# Patient Record
Sex: Female | Born: 1945 | Race: White | Hispanic: No | Smoking: Former smoker
Health system: Southern US, Community
[De-identification: ages and names within clinical notes are randomized; demographics above are authoritative.]

## PROBLEM LIST (undated history)

## (undated) DIAGNOSIS — I4891 Unspecified atrial fibrillation: Secondary | ICD-10-CM

## (undated) DIAGNOSIS — N390 Urinary tract infection, site not specified: Secondary | ICD-10-CM

## (undated) DIAGNOSIS — E039 Hypothyroidism, unspecified: Secondary | ICD-10-CM

## (undated) DIAGNOSIS — K219 Gastro-esophageal reflux disease without esophagitis: Secondary | ICD-10-CM

## (undated) DIAGNOSIS — J45909 Unspecified asthma, uncomplicated: Secondary | ICD-10-CM

## (undated) DIAGNOSIS — I340 Nonrheumatic mitral (valve) insufficiency: Secondary | ICD-10-CM

## (undated) DIAGNOSIS — F419 Anxiety disorder, unspecified: Secondary | ICD-10-CM

## (undated) DIAGNOSIS — R7303 Prediabetes: Secondary | ICD-10-CM

## (undated) DIAGNOSIS — E669 Obesity, unspecified: Secondary | ICD-10-CM

## (undated) DIAGNOSIS — I351 Nonrheumatic aortic (valve) insufficiency: Secondary | ICD-10-CM

## (undated) DIAGNOSIS — I10 Essential (primary) hypertension: Secondary | ICD-10-CM

## (undated) DIAGNOSIS — M1990 Unspecified osteoarthritis, unspecified site: Secondary | ICD-10-CM

## (undated) DIAGNOSIS — Z889 Allergy status to unspecified drugs, medicaments and biological substances status: Secondary | ICD-10-CM

## (undated) DIAGNOSIS — E785 Hyperlipidemia, unspecified: Secondary | ICD-10-CM

## (undated) DIAGNOSIS — D179 Benign lipomatous neoplasm, unspecified: Secondary | ICD-10-CM

## (undated) DIAGNOSIS — Z8489 Family history of other specified conditions: Secondary | ICD-10-CM

## (undated) DIAGNOSIS — D649 Anemia, unspecified: Secondary | ICD-10-CM

## (undated) DIAGNOSIS — I4892 Unspecified atrial flutter: Secondary | ICD-10-CM

## (undated) DIAGNOSIS — E079 Disorder of thyroid, unspecified: Secondary | ICD-10-CM

## (undated) HISTORY — PX: TONSILLECTOMY: SUR1361

## (undated) HISTORY — PX: ROTATOR CUFF REPAIR: SHX139

## (undated) HISTORY — PX: COLONOSCOPY: SHX174

---

## 1974-09-17 HISTORY — PX: VAGINAL HYSTERECTOMY: SUR661

## 1977-09-17 HISTORY — PX: OTHER SURGICAL HISTORY: SHX169

## 1978-09-17 HISTORY — PX: APPENDECTOMY: SHX54

## 1979-09-18 HISTORY — PX: BACK SURGERY: SHX140

## 2012-09-17 HISTORY — PX: KNEE ARTHROSCOPY W/ MENISCAL REPAIR: SHX1877

## 2013-06-25 HISTORY — PX: CARDIOVERSION: SHX1299

## 2014-01-30 HISTORY — PX: ATRIAL FIBRILLATION ABLATION: SHX5732

## 2014-12-29 ENCOUNTER — Other Ambulatory Visit (HOSPITAL_COMMUNITY): Payer: Self-pay | Admitting: Orthopaedic Surgery

## 2015-01-04 NOTE — Pre-Procedure Instructions (Signed)
Kim Rice  01/04/2015   Your procedure is scheduled on:  Tues, May 3 @ 12:50 PM  Report to [facility] and go to Admitting at 10:45 AM.  Call this number if you have problems the morning of surgery: [phone]   Remember:   Do not eat food or drink liquids after midnight.   Take these medicines the morning of surgery with A SIP OF WATER: Amlodipine(Norvasc),Atenolol(Tenormin),Nexium(Esomeprazole),Advair<Bring Your Inhaler With You>,and Synthroid(Levothyroxine)               Stop taking your Naproxen and Aspirin. No Goody's,BC's,Ibuprofen,Fish Oil,or any Herbal Medications.    Do not wear jewelry, make-up or nail polish.  Do not wear lotions, powders, or perfumes. You may wear deodorant.  Do not shave 48 hours prior to surgery.   Do not bring valuables to the hospital.  [facility] is not responsible                  for any belongings or valuables.               Contacts, dentures or bridgework may not be worn into surgery.  Leave suitcase in the car. After surgery it may be brought to your room.  For patients admitted to the hospital, discharge time is determined by your                treatment team.                Special Instructions:  [place] - Preparing for Surgery  Before surgery, you can play an important role.  Because skin is not sterile, your skin needs to be as free of germs as possible.  You can reduce the number of germs on you skin by washing with CHG (chlorahexidine gluconate) soap before surgery.  CHG is an antiseptic cleaner which kills germs and bonds with the skin to continue killing germs even after washing.  Please DO NOT use if you have an allergy to CHG or antibacterial soaps.  If your skin becomes reddened/irritated stop using the CHG and inform your nurse when you arrive at Short Stay.  Do not shave (including legs and underarms) for at least 48 hours prior to the first CHG shower.  You may shave your face.  Please follow these  instructions carefully:   1.  Shower with CHG Soap the night before surgery and the                                morning of Surgery.  2.  If you choose to wash your hair, wash your hair first as usual with your       normal shampoo.  3.  After you shampoo, rinse your hair and body thoroughly to remove the                      Shampoo.  4.  Use CHG as you would any other liquid soap.  You can apply chg directly       to the skin and wash gently with scrungie or a clean washcloth.  5.  Apply the CHG Soap to your body ONLY FROM THE NECK DOWN.        Do not use on open wounds or open sores.  Avoid contact with your eyes,       ears, mouth and genitals (private parts).  Wash genitals (  private parts)       with your normal soap.  6.  Wash thoroughly, paying special attention to the area where your surgery        will be performed.  7.  Thoroughly rinse your body with warm water from the neck down.  8.  DO NOT shower/wash with your normal soap after using and rinsing off       the CHG Soap.  9.  Pat yourself dry with a clean towel.            10.  Wear clean pajamas.            11.  Place clean sheets on your bed the night of your first shower and do not        sleep with pets.  Day of Surgery  Do not apply any lotions/deoderants the morning of surgery.  Please wear clean clothes to the hospital/surgery center.     Please read over the following fact sheets that you were given: Pain Booklet, Coughing and Deep Breathing, MRSA Information and Surgical Site Infection Prevention

## 2015-01-05 ENCOUNTER — Encounter (HOSPITAL_COMMUNITY)
Admission: RE | Admit: 2015-01-05 | Discharge: 2015-01-05 | Disposition: A | Discharge status: Home / Self Care | Payer: Medicare Other | Source: Ambulatory Visit | Attending: Orthopaedic Surgery | Admitting: Orthopaedic Surgery

## 2015-01-05 ENCOUNTER — Encounter (HOSPITAL_COMMUNITY): Payer: Self-pay

## 2015-01-05 DIAGNOSIS — Z87891 Personal history of nicotine dependence: Secondary | ICD-10-CM | POA: Diagnosis not present

## 2015-01-05 DIAGNOSIS — I4892 Unspecified atrial flutter: Secondary | ICD-10-CM | POA: Insufficient documentation

## 2015-01-05 DIAGNOSIS — Z01810 Encounter for preprocedural cardiovascular examination: Secondary | ICD-10-CM | POA: Insufficient documentation

## 2015-01-05 DIAGNOSIS — I4891 Unspecified atrial fibrillation: Secondary | ICD-10-CM | POA: Diagnosis not present

## 2015-01-05 DIAGNOSIS — Z01812 Encounter for preprocedural laboratory examination: Secondary | ICD-10-CM | POA: Diagnosis not present

## 2015-01-05 DIAGNOSIS — M179 Osteoarthritis of knee, unspecified: Secondary | ICD-10-CM | POA: Insufficient documentation

## 2015-01-05 DIAGNOSIS — E785 Hyperlipidemia, unspecified: Secondary | ICD-10-CM | POA: Diagnosis not present

## 2015-01-05 DIAGNOSIS — E039 Hypothyroidism, unspecified: Secondary | ICD-10-CM | POA: Diagnosis not present

## 2015-01-05 DIAGNOSIS — Z01818 Encounter for other preprocedural examination: Secondary | ICD-10-CM | POA: Insufficient documentation

## 2015-01-05 DIAGNOSIS — I10 Essential (primary) hypertension: Secondary | ICD-10-CM | POA: Diagnosis not present

## 2015-01-05 HISTORY — DX: Prediabetes: R73.03

## 2015-01-05 HISTORY — DX: Disorder of thyroid, unspecified: E07.9

## 2015-01-05 HISTORY — DX: Family history of other specified conditions: Z84.89

## 2015-01-05 HISTORY — DX: Unspecified asthma, uncomplicated: J45.909

## 2015-01-05 HISTORY — DX: Unspecified osteoarthritis, unspecified site: M19.90

## 2015-01-05 HISTORY — DX: Gastro-esophageal reflux disease without esophagitis: K21.9

## 2015-01-05 HISTORY — DX: Hypothyroidism, unspecified: E03.9

## 2015-01-05 HISTORY — DX: Nonrheumatic mitral (valve) insufficiency: I34.0

## 2015-01-05 HISTORY — DX: Benign lipomatous neoplasm, unspecified: D17.9

## 2015-01-05 HISTORY — DX: Unspecified atrial flutter: I48.92

## 2015-01-05 HISTORY — DX: Allergy status to unspecified drugs, medicaments and biological substances: Z88.9

## 2015-01-05 HISTORY — DX: Urinary tract infection, site not specified: N39.0

## 2015-01-05 HISTORY — DX: Anxiety disorder, unspecified: F41.9

## 2015-01-05 HISTORY — DX: Anemia, unspecified: D64.9

## 2015-01-05 HISTORY — DX: Hyperlipidemia, unspecified: E78.5

## 2015-01-05 HISTORY — DX: Nonrheumatic aortic (valve) insufficiency: I35.1

## 2015-01-05 HISTORY — DX: Unspecified atrial fibrillation: I48.91

## 2015-01-05 HISTORY — DX: Essential (primary) hypertension: I10

## 2015-01-05 HISTORY — DX: Obesity, unspecified: E66.9

## 2015-01-05 LAB — SURGICAL PCR SCREEN
MRSA, PCR: NEGATIVE
Staphylococcus aureus: NEGATIVE

## 2015-01-05 LAB — BASIC METABOLIC PANEL
Anion gap: 9 (ref 5–15)
BUN: 17 mg/dL (ref 6–23)
CALCIUM: 9.6 mg/dL (ref 8.4–10.5)
CO2: 25 mmol/L (ref 19–32)
Chloride: 106 mmol/L (ref 96–112)
Creatinine, Ser: 0.79 mg/dL (ref 0.50–1.10)
GFR calc Af Amer: >90 mL/min (ref >90)
GFR calc non Af Amer: 84 mL/min — ABNORMAL LOW (ref >90)
GLUCOSE: 96 mg/dL (ref 70–99)
Potassium: 3.6 mmol/L (ref 3.5–5.1)
Sodium: 140 mmol/L (ref 135–145)

## 2015-01-05 LAB — CBC
HEMATOCRIT: 41.3 % (ref 36.0–46.0)
Hemoglobin: 14.1 g/dL (ref 12.0–15.0)
MCH: 30.7 pg (ref 26.0–34.0)
MCHC: 34.1 g/dL (ref 30.0–36.0)
MCV: 90 fL (ref 78.0–100.0)
PLATELETS: 280 10*3/uL (ref 150–400)
RBC: 4.59 MIL/uL (ref 3.87–5.11)
RDW: 12.9 % (ref 11.5–15.5)
WBC: 9.6 10*3/uL (ref 4.0–10.5)

## 2015-01-05 LAB — PROTIME-INR
INR: 1 (ref 0.00–1.49)
PROTHROMBIN TIME: 13.3 s (ref 11.6–15.2)

## 2015-01-05 LAB — APTT: aPTT: 37 seconds (ref 24–37)

## 2015-01-05 NOTE — Progress Notes (Signed)
Pt has history of atrial fib and atrial flutter, had an ablation 01/2014 and has not been in either one since. She states she has been told that she has aortic valve regurgitation, mitral valve regurgitation and aortic stenosis, but as far as she knows she has not had any problems with them. Pt denies any chest pain or sob. Have requested cardiac studies from [facility] and [facility]

## 2015-01-05 NOTE — Pre-Procedure Instructions (Signed)
Kim Rice  01/05/2015   Your procedure is scheduled on:  Tues, May 3 @ 12:50 PM  Report to [facility] and go to Admitting at 10:45 AM.  Call this number if you have problems the morning of surgery: [phone]   Remember:   Do not eat food or drink liquids after midnight.   Take these medicines the morning of surgery with A SIP OF WATER: Amlodipine(Norvasc), Atenolol(Tenormin), Nexium(Esomeprazole), Synthroid(Levothyroxine), Advair Inhaler - if needed               Stop taking your Naproxen and Aspirin 7 days prior to surgery. No Goody's,BC's,Ibuprofen,Fish Oil,or any Herbal Medications.    Do not wear jewelry, make-up or nail polish.  Do not wear lotions, powders, or perfumes. You may wear deodorant.  Do not shave 48 hours prior to surgery.   Do not bring valuables to the hospital.  [facility] is not responsible                  for any belongings or valuables.               Contacts, dentures or bridgework may not be worn into surgery.  Leave suitcase in the car. After surgery it may be brought to your room.  For patients admitted to the hospital, discharge time is determined by your                treatment team.                Special Instructions:  [place] - Preparing for Surgery  Before surgery, you can play an important role.  Because skin is not sterile, your skin needs to be as free of germs as possible.  You can reduce the number of germs on you skin by washing with CHG (chlorahexidine gluconate) soap before surgery.  CHG is an antiseptic cleaner which kills germs and bonds with the skin to continue killing germs even after washing.  Please DO NOT use if you have an allergy to CHG or antibacterial soaps.  If your skin becomes reddened/irritated stop using the CHG and inform your nurse when you arrive at Short Stay.  Do not shave (including legs and underarms) for at least 48 hours prior to the first CHG shower.  You may shave your face.  Please follow  these instructions carefully:   1.  Shower with CHG Soap the night before surgery and the                                morning of Surgery.  2.  If you choose to wash your hair, wash your hair first as usual with your       normal shampoo.  3.  After you shampoo, rinse your hair and body thoroughly to remove the                      Shampoo.  4.  Use CHG as you would any other liquid soap.  You can apply chg directly       to the skin and wash gently with scrungie or a clean washcloth.  5.  Apply the CHG Soap to your body ONLY FROM THE NECK DOWN.        Do not use on open wounds or open sores.  Avoid contact with your eyes, ears, mouth and genitals (private parts).  Wash genitals (private parts) with your normal soap.  6.  Wash thoroughly, paying special attention to the area where your surgery        will be performed.  7.  Thoroughly rinse your body with warm water from the neck down.  8.  DO NOT shower/wash with your normal soap after using and rinsing off       the CHG Soap.  9.  Pat yourself dry with a clean towel.            10.  Wear clean pajamas.            11.  Place clean sheets on your bed the night of your first shower and do not        sleep with pets.  Day of Surgery  Do not apply any lotions the morning of surgery.  Please wear clean clothes to the hospital.     Please read over the following fact sheets that you were given: Pain Booklet, Coughing and Deep Breathing, MRSA Information and Surgical Site Infection Prevention

## 2015-01-05 NOTE — Pre-Procedure Instructions (Signed)
Kim Rice  01/05/2015   Your procedure is scheduled on:  Tuesday, Jan 18, 2015 at 12:50 PM   Report to [facility] Entrance A and go to Admitting at 10:45 AM.   Call this number if you have problems the morning of surgery: [phone]               Any questions prior to day of surgery, please call [phone] between 8 & 4 PM.   Remember:   Do not eat food or drink liquids after midnight Monday, 01/17/15.   Take these medicines the morning of surgery with A SIP OF WATER: Amlodipine(Norvasc), Atenolol(Tenormin), Nexium(Esomeprazole), Synthroid(Levothyroxine), Advair Inhaler - if needed               Stop taking your Naproxen and Aspirin 7 days prior to surgery. No Goody's,BC's,Ibuprofen,Fish Oil,or any Herbal Medications.    Do not wear jewelry, make-up or nail polish.  Do not wear lotions, powders, or perfumes. You may wear deodorant.  Do not shave 48 hours prior to surgery.   Do not bring valuables to the hospital.  [facility] is not responsible                  for any belongings or valuables.               Contacts, dentures or bridgework may not be worn into surgery.  Leave suitcase in the car. After surgery it may be brought to your room.  For patients admitted to the hospital, discharge time is determined by your                treatment team.                Special Instructions:  [place] - Preparing for Surgery  Before surgery, you can play an important role.  Because skin is not sterile, your skin needs to be as free of germs as possible.  You can reduce the number of germs on you skin by washing with CHG (chlorahexidine gluconate) soap before surgery.  CHG is an antiseptic cleaner which kills germs and bonds with the skin to continue killing germs even after washing.  Please DO NOT use if you have an allergy to CHG or antibacterial soaps.  If your skin becomes reddened/irritated stop using the CHG and inform your nurse when you arrive at Short Stay.  Do not shave  (including legs and underarms) for at least 48 hours prior to the first CHG shower.  You may shave your face.  Please follow these instructions carefully:   1.  Shower with CHG Soap the night before surgery and the                                morning of Surgery.  2.  If you choose to wash your hair, wash your hair first as usual with your       normal shampoo.  3.  After you shampoo, rinse your hair and body thoroughly to remove the                      Shampoo.  4.  Use CHG as you would any other liquid soap.  You can apply chg directly       to the skin and wash gently with scrungie or a clean washcloth.  5.  Apply the CHG Soap to your body  ONLY FROM THE NECK DOWN.        Do not use on open wounds or open sores.  Avoid contact with your eyes, ears, mouth and genitals (private parts).  Wash genitals (private parts) with your normal soap.  6.  Wash thoroughly, paying special attention to the area where your surgery        will be performed.  7.  Thoroughly rinse your body with warm water from the neck down.  8.  DO NOT shower/wash with your normal soap after using and rinsing off       the CHG Soap.  9.  Pat yourself dry with a clean towel.            10.  Wear clean pajamas.            11.  Place clean sheets on your bed the night of your first shower and do not        sleep with pets.  Day of Surgery  Do not apply any lotions the morning of surgery.  Please wear clean clothes to the hospital.     Please read over the following fact sheets that you were given: Pain Booklet, Coughing and Deep Breathing, MRSA Information and Surgical Site Infection Prevention

## 2015-01-06 NOTE — Progress Notes (Signed)
error 

## 2015-01-06 NOTE — Progress Notes (Addendum)
Anesthesia Chart Review: Patient is a 69 year old female scheduled for left TKA on 01/18/15 by Dr. Jean Rosenthal.  History includes former smoker, afib/flutter s/p cardioversion '14 and ablation 01/2014 (EP cardiologist Dr. Minna Merritts), mild valvular disease (mild AI, trace MR/TR '15), HTN, HLD, hypothyroidism. Primary cardiologist is Dr. Jimmie Molly in [place] [phone]; closed early this afternoon). Patient reports she has been seen once by one of her cardiologists following her ablation and was told she could stop anticoagulation therapy.  PCP is Dr. Bea Graff.   01/05/15 EKG: NSR.  01/18/14 TEE ([facility]): Normal LV size and systolic function, with no segmental abnormality. Mild AI. Trace MR/TR. No thrombus noted.  02/23/13 Exercise [facility]): Ischemia involving anteroseptal wall. Normal gated imaging. Normal EF 59%.   I am still waiting for cardiology records, but she denies ever being told that she needs a cardiac cath. Since this study is nearly two years old, and she went on to have knee arthroscopy 07/2013 and afib ablation 01/2014, it may be that Dr. Jimmie Molly did not feel stress test abnormality was significant. She denied known CAD.   Preoperative labs noted. Surgeon did not order T&S or UA.  Discussed with anesthesiologist Dr. Tobias Alexander. Recommend that cardiology clarify if any additional testing needed prior to surgery.  She did not report any new CV issues, and her EKG showed NSR. I've notified Magnet at Dr. Trevor Mace office.  George Hugh [facility] Short Stay Center/Anesthesiology Phone [phone] 01/07/2015 2:57 PM  Addendum: I received cardiology records obtained by Dr. Trevor Mace office dating from 02/2013 - 06/2014. According to Dr. Andrey Campanile 03/03/13 note regarding stress test follow-up: Impression: Mildly abnormal perfusion stress "low risk."  He goes on to state, "In reviewing the images, I'm impressed that there is a limited area of only mild decreased  count density in the mid anterior wall, minimally more prominent on stress than on rest, and not involving the true apex, making significant LAD disease less likely. This results could reflect some degree of coronary stenosis in anterior distribution and potential for ischemia, but based on limited severity and extent, and normal LV function, is certainly a "low risk" perfusion stress test."  From his 07/10/13 visit, he cleared her for general anesthesia for knee surgery with "low risk."  She went on to have atrial ablation 01/2014 for multiple runs of SVT on Zio Patch monitor. She last saw Dr. Jimmie Molly 03/2014 and Dr. Minna Merritts 06/2014.  Dr. Minna Merritts was aware she may need TKA at that time.   I reviewed new records with anesthesiologist Dr. Oletta Lamas. She recommended patient have either medical OR cardiology clearance prior to her procedure if not already obtained.  Voice message left for Sherrie at Dr. Trevor Mace office.  George Hugh [facility] Short Stay Center/Anesthesiology Phone [phone] 01/13/2015 12:09 PM  Addendum: I left a voice message for Sherrie at Dr. Trevor Mace office re: clearance follow-up.  PAT RN note indicates that Ardine Eng, RN spoke with Dondra Spry and was told that patient's cardiologist felt she was "low risk." Sherrie will fax formal clearance note once received. (Update: Clearance note from Dr. Minna Merritts received.)  Myra Gianotti, [facility] Short Stay Center/Anesthesiology Phone [phone] 01/17/2015 3:33 PM

## 2015-01-07 ENCOUNTER — Encounter (HOSPITAL_COMMUNITY): Payer: Self-pay

## 2015-01-17 MED ORDER — CEFAZOLIN SODIUM-DEXTROSE 2-3 GM-% IV SOLR
2.0000 g | INTRAVENOUS | Status: AC
  Administered 2015-01-18: 2 g via INTRAVENOUS
  Filled 2015-01-17: qty 50

## 2015-01-17 NOTE — Progress Notes (Signed)
Spoke with Sherrie in Dr. Forest Gleason stated that the pt.'s cardiologist said the pt. Was at low risk. She is trying to get written confirmation and will fax as soon as available.

## 2015-01-18 ENCOUNTER — Inpatient Hospital Stay (HOSPITAL_COMMUNITY): Payer: Medicare Other | Admitting: Certified Registered"

## 2015-01-18 ENCOUNTER — Inpatient Hospital Stay (HOSPITAL_COMMUNITY)
Admission: RE | Admit: 2015-01-18 | Discharge: 2015-01-20 | DRG: 470 | Disposition: A | Discharge status: Home Health | Payer: Medicare Other | Source: Ambulatory Visit | Attending: Orthopaedic Surgery | Admitting: Orthopaedic Surgery

## 2015-01-18 ENCOUNTER — Encounter (HOSPITAL_COMMUNITY)
Admission: RE | Disposition: A | Discharge status: Home Health | Payer: Self-pay | Source: Ambulatory Visit | Attending: Orthopaedic Surgery

## 2015-01-18 ENCOUNTER — Inpatient Hospital Stay (HOSPITAL_COMMUNITY): Payer: Medicare Other | Admitting: Vascular Surgery

## 2015-01-18 ENCOUNTER — Encounter (HOSPITAL_COMMUNITY): Payer: Self-pay | Admitting: Certified Registered"

## 2015-01-18 ENCOUNTER — Inpatient Hospital Stay (HOSPITAL_COMMUNITY): Payer: Medicare Other

## 2015-01-18 DIAGNOSIS — Z96652 Presence of left artificial knee joint: Secondary | ICD-10-CM

## 2015-01-18 DIAGNOSIS — Z87891 Personal history of nicotine dependence: Secondary | ICD-10-CM | POA: Diagnosis not present

## 2015-01-18 DIAGNOSIS — I340 Nonrheumatic mitral (valve) insufficiency: Secondary | ICD-10-CM | POA: Diagnosis present

## 2015-01-18 DIAGNOSIS — E785 Hyperlipidemia, unspecified: Secondary | ICD-10-CM | POA: Diagnosis present

## 2015-01-18 DIAGNOSIS — I4891 Unspecified atrial fibrillation: Secondary | ICD-10-CM | POA: Diagnosis present

## 2015-01-18 DIAGNOSIS — E669 Obesity, unspecified: Secondary | ICD-10-CM | POA: Diagnosis present

## 2015-01-18 DIAGNOSIS — Z87440 Personal history of urinary (tract) infections: Secondary | ICD-10-CM | POA: Diagnosis not present

## 2015-01-18 DIAGNOSIS — Z885 Allergy status to narcotic agent status: Secondary | ICD-10-CM

## 2015-01-18 DIAGNOSIS — I10 Essential (primary) hypertension: Secondary | ICD-10-CM | POA: Diagnosis present

## 2015-01-18 DIAGNOSIS — M1712 Unilateral primary osteoarthritis, left knee: Principal | ICD-10-CM | POA: Diagnosis present

## 2015-01-18 DIAGNOSIS — K219 Gastro-esophageal reflux disease without esophagitis: Secondary | ICD-10-CM | POA: Diagnosis present

## 2015-01-18 DIAGNOSIS — Z79899 Other long term (current) drug therapy: Secondary | ICD-10-CM | POA: Diagnosis not present

## 2015-01-18 DIAGNOSIS — I351 Nonrheumatic aortic (valve) insufficiency: Secondary | ICD-10-CM | POA: Diagnosis present

## 2015-01-18 DIAGNOSIS — E039 Hypothyroidism, unspecified: Secondary | ICD-10-CM | POA: Diagnosis present

## 2015-01-18 DIAGNOSIS — Z91040 Latex allergy status: Secondary | ICD-10-CM | POA: Diagnosis not present

## 2015-01-18 DIAGNOSIS — M25562 Pain in left knee: Secondary | ICD-10-CM | POA: Diagnosis present

## 2015-01-18 DIAGNOSIS — Z7982 Long term (current) use of aspirin: Secondary | ICD-10-CM

## 2015-01-18 DIAGNOSIS — I4892 Unspecified atrial flutter: Secondary | ICD-10-CM | POA: Diagnosis present

## 2015-01-18 DIAGNOSIS — F419 Anxiety disorder, unspecified: Secondary | ICD-10-CM | POA: Diagnosis present

## 2015-01-18 HISTORY — DX: Presence of left artificial knee joint: Z96.652

## 2015-01-18 HISTORY — DX: Unilateral primary osteoarthritis, left knee: M17.12

## 2015-01-18 HISTORY — PX: TOTAL KNEE ARTHROPLASTY: SHX125

## 2015-01-18 SURGERY — ARTHROPLASTY, KNEE, TOTAL
Anesthesia: Regional | Site: Knee | Laterality: Left

## 2015-01-18 MED ORDER — POLYETHYLENE GLYCOL 3350 17 G PO PACK: 17.0000 g | PACK | Freq: Every day | ORAL | Status: DC | PRN

## 2015-01-18 MED ORDER — FUROSEMIDE 20 MG PO TABS
20.0000 mg | ORAL_TABLET | Freq: Every day | ORAL | Status: DC
  Administered 2015-01-19 – 2015-01-20 (×2): 20 mg via ORAL
  Filled 2015-01-18 (×2): qty 1

## 2015-01-18 MED ORDER — ALUM & MAG HYDROXIDE-SIMETH 200-200-20 MG/5ML PO SUSP: 30.0000 mL | ORAL | Status: DC | PRN

## 2015-01-18 MED ORDER — HYDROMORPHONE HCL 1 MG/ML IJ SOLN
1.0000 mg | INTRAMUSCULAR | Status: DC | PRN
  Administered 2015-01-18 – 2015-01-19 (×4): 1 mg via INTRAVENOUS
  Filled 2015-01-18 (×4): qty 1

## 2015-01-18 MED ORDER — BUPIVACAINE-EPINEPHRINE (PF) 0.5% -1:200000 IJ SOLN
INTRAMUSCULAR | Status: DC | PRN
  Administered 2015-01-18: 30 mL via PERINEURAL

## 2015-01-18 MED ORDER — OXYCODONE HCL 5 MG/5ML PO SOLN: 5.0000 mg | Freq: Once | ORAL | Status: AC | PRN

## 2015-01-18 MED ORDER — MENTHOL 3 MG MT LOZG: 1.0000 | LOZENGE | OROMUCOSAL | Status: DC | PRN

## 2015-01-18 MED ORDER — ZOLPIDEM TARTRATE 5 MG PO TABS: 5.0000 mg | ORAL_TABLET | Freq: Every evening | ORAL | Status: DC | PRN

## 2015-01-18 MED ORDER — ATENOLOL 50 MG PO TABS
100.0000 mg | ORAL_TABLET | Freq: Every day | ORAL | Status: DC
  Administered 2015-01-19 – 2015-01-20 (×2): 100 mg via ORAL
  Filled 2015-01-18 (×2): qty 2

## 2015-01-18 MED ORDER — METHOCARBAMOL 500 MG PO TABS
500.0000 mg | ORAL_TABLET | Freq: Four times a day (QID) | ORAL | Status: DC | PRN
  Administered 2015-01-18 – 2015-01-20 (×4): 500 mg via ORAL
  Filled 2015-01-18 (×7): qty 1

## 2015-01-18 MED ORDER — FENTANYL CITRATE (PF) 100 MCG/2ML IJ SOLN
INTRAMUSCULAR | Status: AC
  Administered 2015-01-18: 100 ug
  Filled 2015-01-18: qty 2

## 2015-01-18 MED ORDER — CEFAZOLIN SODIUM 1-5 GM-% IV SOLN
1.0000 g | Freq: Four times a day (QID) | INTRAVENOUS | Status: AC
  Administered 2015-01-18 – 2015-01-19 (×2): 1 g via INTRAVENOUS
  Filled 2015-01-18 (×2): qty 50

## 2015-01-18 MED ORDER — ONDANSETRON HCL 4 MG PO TABS: 4.0000 mg | ORAL_TABLET | Freq: Four times a day (QID) | ORAL | Status: DC | PRN

## 2015-01-18 MED ORDER — ADULT MULTIVITAMIN W/MINERALS CH
1.0000 | ORAL_TABLET | Freq: Every day | ORAL | Status: DC
  Filled 2015-01-18 (×2): qty 1

## 2015-01-18 MED ORDER — PROPOFOL 10 MG/ML IV BOLUS
INTRAVENOUS | Status: DC | PRN
  Administered 2015-01-18: 150 mg via INTRAVENOUS

## 2015-01-18 MED ORDER — MIDAZOLAM HCL 5 MG/5ML IJ SOLN
INTRAMUSCULAR | Status: DC | PRN
  Administered 2015-01-18: 2 mg via INTRAVENOUS

## 2015-01-18 MED ORDER — ONDANSETRON HCL 4 MG/2ML IJ SOLN
INTRAMUSCULAR | Status: DC | PRN
  Administered 2015-01-18: 4 mg via INTRAVENOUS

## 2015-01-18 MED ORDER — ONE-DAILY MULTI VITAMINS PO TABS: 1.0000 | ORAL_TABLET | Freq: Every day | ORAL | Status: DC

## 2015-01-18 MED ORDER — METHOCARBAMOL 1000 MG/10ML IJ SOLN
500.0000 mg | Freq: Four times a day (QID) | INTRAVENOUS | Status: DC | PRN
  Filled 2015-01-18: qty 5

## 2015-01-18 MED ORDER — HYDROMORPHONE HCL 1 MG/ML IJ SOLN
INTRAMUSCULAR | Status: AC
  Filled 2015-01-18: qty 1

## 2015-01-18 MED ORDER — METHOCARBAMOL 500 MG PO TABS
ORAL_TABLET | ORAL | Status: AC
  Filled 2015-01-18: qty 1

## 2015-01-18 MED ORDER — AMLODIPINE BESYLATE 5 MG PO TABS
5.0000 mg | ORAL_TABLET | Freq: Two times a day (BID) | ORAL | Status: DC
  Administered 2015-01-18 – 2015-01-20 (×4): 5 mg via ORAL
  Filled 2015-01-18 (×4): qty 1

## 2015-01-18 MED ORDER — BUPIVACAINE LIPOSOME 1.3 % IJ SUSP
20.0000 mL | Freq: Once | INTRAMUSCULAR | Status: DC
  Filled 2015-01-18: qty 20

## 2015-01-18 MED ORDER — LIDOCAINE HCL (CARDIAC) 10 MG/ML IV SOLN
INTRAVENOUS | Status: DC | PRN
  Administered 2015-01-18: 100 mg via INTRAVENOUS

## 2015-01-18 MED ORDER — OXYCODONE HCL 5 MG PO TABS
ORAL_TABLET | ORAL | Status: AC
  Filled 2015-01-18: qty 1

## 2015-01-18 MED ORDER — DOCUSATE SODIUM 100 MG PO CAPS
100.0000 mg | ORAL_CAPSULE | Freq: Two times a day (BID) | ORAL | Status: DC
  Administered 2015-01-18 – 2015-01-20 (×4): 100 mg via ORAL
  Filled 2015-01-18 (×4): qty 1

## 2015-01-18 MED ORDER — OXYCODONE HCL 5 MG PO TABS
5.0000 mg | ORAL_TABLET | ORAL | Status: DC | PRN
  Administered 2015-01-18 – 2015-01-20 (×12): 10 mg via ORAL
  Filled 2015-01-18 (×6): qty 2
  Filled 2015-01-18: qty 1
  Filled 2015-01-18 (×6): qty 2

## 2015-01-18 MED ORDER — LACTATED RINGERS IV SOLN
INTRAVENOUS | Status: DC
  Administered 2015-01-18 (×2): via INTRAVENOUS

## 2015-01-18 MED ORDER — SODIUM CHLORIDE 0.9 % IV SOLN
INTRAVENOUS | Status: DC
  Administered 2015-01-19: 07:00:00 via INTRAVENOUS

## 2015-01-18 MED ORDER — ROSUVASTATIN CALCIUM 10 MG PO TABS
20.0000 mg | ORAL_TABLET | Freq: Every day | ORAL | Status: DC
  Administered 2015-01-19: 20 mg via ORAL
  Filled 2015-01-18: qty 2

## 2015-01-18 MED ORDER — MIDAZOLAM HCL 2 MG/2ML IJ SOLN
INTRAMUSCULAR | Status: AC
  Administered 2015-01-18: 2 mg
  Filled 2015-01-18: qty 2

## 2015-01-18 MED ORDER — METOCLOPRAMIDE HCL 5 MG PO TABS: 5.0000 mg | ORAL_TABLET | Freq: Three times a day (TID) | ORAL | Status: DC | PRN

## 2015-01-18 MED ORDER — RIVAROXABAN 10 MG PO TABS
10.0000 mg | ORAL_TABLET | Freq: Every day | ORAL | Status: DC
  Administered 2015-01-20: 10 mg via ORAL
  Filled 2015-01-18 (×2): qty 1

## 2015-01-18 MED ORDER — ACETAMINOPHEN 325 MG PO TABS: 650.0000 mg | ORAL_TABLET | Freq: Four times a day (QID) | ORAL | Status: DC | PRN

## 2015-01-18 MED ORDER — LISINOPRIL 5 MG PO TABS
5.0000 mg | ORAL_TABLET | Freq: Every day | ORAL | Status: DC
  Administered 2015-01-18 – 2015-01-20 (×3): 5 mg via ORAL
  Filled 2015-01-18 (×3): qty 1

## 2015-01-18 MED ORDER — MIDAZOLAM HCL 2 MG/2ML IJ SOLN
INTRAMUSCULAR | Status: AC
  Filled 2015-01-18: qty 2

## 2015-01-18 MED ORDER — ONDANSETRON HCL 4 MG/2ML IJ SOLN
4.0000 mg | Freq: Four times a day (QID) | INTRAMUSCULAR | Status: DC | PRN
  Administered 2015-01-19: 4 mg via INTRAVENOUS
  Filled 2015-01-18 (×3): qty 2

## 2015-01-18 MED ORDER — OXYCODONE HCL 5 MG PO TABS
5.0000 mg | ORAL_TABLET | Freq: Once | ORAL | Status: AC | PRN
  Administered 2015-01-18: 5 mg via ORAL

## 2015-01-18 MED ORDER — FENTANYL CITRATE (PF) 100 MCG/2ML IJ SOLN
INTRAMUSCULAR | Status: DC | PRN
  Administered 2015-01-18 (×6): 25 ug via INTRAVENOUS

## 2015-01-18 MED ORDER — METOCLOPRAMIDE HCL 5 MG/ML IJ SOLN
5.0000 mg | Freq: Three times a day (TID) | INTRAMUSCULAR | Status: DC | PRN
Start: 2015-01-18 — End: 2015-01-20
  Administered 2015-01-19: 10 mg via INTRAVENOUS
  Filled 2015-01-18: qty 2

## 2015-01-18 MED ORDER — FENTANYL CITRATE (PF) 250 MCG/5ML IJ SOLN
INTRAMUSCULAR | Status: AC
  Filled 2015-01-18: qty 5

## 2015-01-18 MED ORDER — PANTOPRAZOLE SODIUM 40 MG PO TBEC
80.0000 mg | DELAYED_RELEASE_TABLET | Freq: Every day | ORAL | Status: DC
  Administered 2015-01-19 – 2015-01-20 (×2): 80 mg via ORAL
  Filled 2015-01-18 (×2): qty 2

## 2015-01-18 MED ORDER — SODIUM CHLORIDE 0.9 % IJ SOLN
INTRAMUSCULAR | Status: DC | PRN
  Administered 2015-01-18: 40 mL

## 2015-01-18 MED ORDER — ACETAMINOPHEN 650 MG RE SUPP: 650.0000 mg | Freq: Four times a day (QID) | RECTAL | Status: DC | PRN

## 2015-01-18 MED ORDER — OXYCODONE HCL 5 MG PO TABS
ORAL_TABLET | ORAL | Status: AC
  Filled 2015-01-18: qty 2

## 2015-01-18 MED ORDER — PROPOFOL 10 MG/ML IV BOLUS
INTRAVENOUS | Status: AC
  Filled 2015-01-18: qty 20

## 2015-01-18 MED ORDER — DIPHENHYDRAMINE HCL 12.5 MG/5ML PO ELIX: 12.5000 mg | ORAL_SOLUTION | ORAL | Status: DC | PRN

## 2015-01-18 MED ORDER — ONDANSETRON HCL 4 MG/2ML IJ SOLN: 4.0000 mg | Freq: Four times a day (QID) | INTRAMUSCULAR | Status: DC | PRN

## 2015-01-18 MED ORDER — SODIUM CHLORIDE 0.9 % IR SOLN
Status: DC | PRN
Start: 2015-01-18 — End: 2015-01-18
  Administered 2015-01-18: 3000 mL
  Administered 2015-01-18: 1000 mL

## 2015-01-18 MED ORDER — LEVOTHYROXINE SODIUM 100 MCG PO TABS
100.0000 ug | ORAL_TABLET | Freq: Every day | ORAL | Status: DC
  Administered 2015-01-19 – 2015-01-20 (×2): 100 ug via ORAL
  Filled 2015-01-18 (×2): qty 1

## 2015-01-18 MED ORDER — PHENOL 1.4 % MT LIQD: 1.0000 | OROMUCOSAL | Status: DC | PRN

## 2015-01-18 MED ORDER — HYDROMORPHONE HCL 1 MG/ML IJ SOLN
0.2500 mg | INTRAMUSCULAR | Status: DC | PRN
  Administered 2015-01-18 (×3): 0.5 mg via INTRAVENOUS
  Administered 2015-01-18: 0.25 mg via INTRAVENOUS

## 2015-01-18 MED ORDER — BUPIVACAINE LIPOSOME 1.3 % IJ SUSP
INTRAMUSCULAR | Status: DC | PRN
  Administered 2015-01-18: 20 mL

## 2015-01-18 SURGICAL SUPPLY — 66 items
BANDAGE ELASTIC 6 VELCRO ST LF (GAUZE/BANDAGES/DRESSINGS) ×3 IMPLANT
BANDAGE ESMARK 6X9 LF (GAUZE/BANDAGES/DRESSINGS) ×1 IMPLANT
BEARING TIBIAL TRIATHLON SZ 2 (Knees) ×3 IMPLANT
BLADE SAG 18X100X1.27 (BLADE) ×3 IMPLANT
BNDG ESMARK 6X9 LF (GAUZE/BANDAGES/DRESSINGS) ×3
BOWL SMART MIX CTS (DISPOSABLE) IMPLANT
CAPT KNEE TOTAL 3 ×3 IMPLANT
CEMENT BONE SIMPLEX SPEEDSET (Cement) ×6 IMPLANT
CLOSURE WOUND 1/2 X4 (GAUZE/BANDAGES/DRESSINGS) ×2
COVER SURGICAL LIGHT HANDLE (MISCELLANEOUS) ×3 IMPLANT
CUFF TOURNIQUET SINGLE 34IN LL (TOURNIQUET CUFF) ×3 IMPLANT
DRAPE IMP U-DRAPE 54X76 (DRAPES) ×3 IMPLANT
DRAPE INCISE IOBAN 66X45 STRL (DRAPES) ×3 IMPLANT
DRAPE ORTHO SPLIT 77X108 STRL (DRAPES) ×4
DRAPE PROXIMA HALF (DRAPES) ×3 IMPLANT
DRAPE SURG ORHT 6 SPLT 77X108 (DRAPES) ×2 IMPLANT
DRAPE U-SHAPE 47X51 STRL (DRAPES) ×3 IMPLANT
DRSG ADAPTIC 3X8 NADH LF (GAUZE/BANDAGES/DRESSINGS) ×3 IMPLANT
DRSG PAD ABDOMINAL 8X10 ST (GAUZE/BANDAGES/DRESSINGS) ×3 IMPLANT
DURAPREP 26ML APPLICATOR (WOUND CARE) ×3 IMPLANT
ELECT REM PT RETURN 9FT ADLT (ELECTROSURGICAL) ×3
ELECTRODE REM PT RTRN 9FT ADLT (ELECTROSURGICAL) ×1 IMPLANT
EVACUATOR 1/8 PVC DRAIN (DRAIN) IMPLANT
FACESHIELD WRAPAROUND (MASK) ×9 IMPLANT
GAUZE SPONGE 4X4 12PLY STRL (GAUZE/BANDAGES/DRESSINGS) ×3 IMPLANT
GAUZE XEROFORM 1X8 LF (GAUZE/BANDAGES/DRESSINGS) ×3 IMPLANT
GLOVE BIOGEL PI IND STRL 7.0 (GLOVE) ×4 IMPLANT
GLOVE BIOGEL PI IND STRL 8 (GLOVE) ×2 IMPLANT
GLOVE BIOGEL PI INDICATOR 7.0 (GLOVE) ×8
GLOVE BIOGEL PI INDICATOR 8 (GLOVE) ×4
GLOVE SURG SS PI 6.5 STRL IVOR (GLOVE) ×3 IMPLANT
GLOVE SURG SS PI 7.5 STRL IVOR (GLOVE) ×3 IMPLANT
GLOVE SURG SS PI 8.0 STRL IVOR (GLOVE) ×6 IMPLANT
GOWN STRL REUS W/ TWL LRG LVL3 (GOWN DISPOSABLE) ×2 IMPLANT
GOWN STRL REUS W/ TWL XL LVL3 (GOWN DISPOSABLE) ×4 IMPLANT
GOWN STRL REUS W/TWL LRG LVL3 (GOWN DISPOSABLE) ×4
GOWN STRL REUS W/TWL XL LVL3 (GOWN DISPOSABLE) ×8
HANDPIECE INTERPULSE COAX TIP (DISPOSABLE)
IMMOBILIZER KNEE 22 UNIV (SOFTGOODS) ×3 IMPLANT
KIT BASIN OR (CUSTOM PROCEDURE TRAY) ×3 IMPLANT
KIT ROOM TURNOVER OR (KITS) ×3 IMPLANT
MANIFOLD NEPTUNE II (INSTRUMENTS) ×3 IMPLANT
NS IRRIG 1000ML POUR BTL (IV SOLUTION) ×3 IMPLANT
PACK TOTAL JOINT (CUSTOM PROCEDURE TRAY) ×3 IMPLANT
PACK UNIVERSAL I (CUSTOM PROCEDURE TRAY) ×3 IMPLANT
PAD ABD 8X10 STRL (GAUZE/BANDAGES/DRESSINGS) ×3 IMPLANT
PAD ARMBOARD 7.5X6 YLW CONV (MISCELLANEOUS) ×6 IMPLANT
PADDING CAST COTTON 6X4 STRL (CAST SUPPLIES) ×3 IMPLANT
SET HNDPC FAN SPRY TIP SCT (DISPOSABLE) IMPLANT
SET PAD KNEE POSITIONER (MISCELLANEOUS) ×3 IMPLANT
SPONGE GAUZE 4X4 12PLY STER LF (GAUZE/BANDAGES/DRESSINGS) ×3 IMPLANT
STAPLER VISISTAT 35W (STAPLE) IMPLANT
STRIP CLOSURE SKIN 1/2X4 (GAUZE/BANDAGES/DRESSINGS) ×4 IMPLANT
SUCTION FRAZIER TIP 10 FR DISP (SUCTIONS) IMPLANT
SUT MNCRL AB 4-0 PS2 18 (SUTURE) ×3 IMPLANT
SUT VIC AB 0 CT1 27 (SUTURE) ×2
SUT VIC AB 0 CT1 27XBRD ANBCTR (SUTURE) ×1 IMPLANT
SUT VIC AB 1 CT1 27 (SUTURE) ×4
SUT VIC AB 1 CT1 27XBRD ANBCTR (SUTURE) ×2 IMPLANT
SUT VIC AB 2-0 CT1 27 (SUTURE) ×4
SUT VIC AB 2-0 CT1 TAPERPNT 27 (SUTURE) ×2 IMPLANT
TOWEL OR 17X24 6PK STRL BLUE (TOWEL DISPOSABLE) ×3 IMPLANT
TOWEL OR 17X26 10 PK STRL BLUE (TOWEL DISPOSABLE) ×3 IMPLANT
TRAY FOLEY CATH 16FRSI W/METER (SET/KITS/TRAYS/PACK) IMPLANT
WATER STERILE IRR 1000ML POUR (IV SOLUTION) ×6 IMPLANT
WRAP KNEE MAXI GEL POST OP (GAUZE/BANDAGES/DRESSINGS) ×3 IMPLANT

## 2015-01-18 NOTE — Transfer of Care (Signed)
Immediate Anesthesia Transfer of Care Note  Patient: Kim Rice  Procedure(s) Performed: Procedure(s): LEFT TOTAL KNEE ARTHROPLASTY (Left)  Patient Location: PACU  Anesthesia Type:GA combined with regional for post-op pain  Level of Consciousness: awake, alert , oriented and patient cooperative  Airway & Oxygen Therapy: Patient Spontanous Breathing and Patient connected to nasal cannula oxygen  Post-op Assessment: Report given to RN, Post -op Vital signs reviewed and stable and Patient moving all extremities  Post vital signs: Reviewed and stable  Last Vitals:  Filed Vitals:   01/18/15 1035  BP: 158/62  Pulse: 64  Temp: 36.6 C  Resp: 21    Complications: No apparent anesthesia complications

## 2015-01-18 NOTE — Progress Notes (Signed)
Utilization review completed.  

## 2015-01-18 NOTE — Anesthesia Procedure Notes (Addendum)
Anesthesia Regional Block:  Adductor canal block  Pre-Anesthetic Checklist: ,, timeout performed, Correct Patient, Correct Site, Correct Laterality, Correct Procedure, Correct Position, site marked, Risks and benefits discussed,  Surgical consent,  Pre-op evaluation,  At surgeon's request and post-op pain management  Laterality: Left  Prep: chloraprep       Needles:  Injection technique: Single-shot  Needle Type: Echogenic Needle     Needle Length: 9cm 9 cm Needle Gauge: 21 and 21 G    Additional Needles:  Procedures: ultrasound guided (picture in chart) Adductor canal block Narrative:  Start time: 01/18/2015 10:52 AM End time: 01/18/2015 11:02 AM Injection made incrementally with aspirations every 5 mL.  Performed by: Personally  Anesthesiologist: HODIERNE, ADAM  Additional Notes: Pt tolerated the procedure well.   Procedure Name: LMA Insertion Date/Time: 01/18/2015 12:03 PM Performed by: Julian Reil Pre-anesthesia Checklist: Patient identified, Emergency Drugs available, Patient being monitored and Suction available Patient Re-evaluated:Patient Re-evaluated prior to inductionOxygen Delivery Method: Circle system utilized Preoxygenation: Pre-oxygenation with 100% oxygen Intubation Type: IV induction LMA: LMA inserted LMA Size: 4.0 Tube type: Oral Number of attempts: 1 Placement Confirmation: positive ETCO2 and breath sounds checked- equal and bilateral Tube secured with: Tape Dental Injury: Teeth and Oropharynx as per pre-operative assessment

## 2015-01-18 NOTE — Anesthesia Postprocedure Evaluation (Signed)
  Anesthesia Post-op Note  Patient: Kim Rice  Procedure(s) Performed: Procedure(s): LEFT TOTAL KNEE ARTHROPLASTY (Left)  Patient Location: PACU  Anesthesia Type:General  Level of Consciousness: awake  Airway and Oxygen Therapy: Patient Spontanous Breathing  Post-op Pain: mild  Post-op Assessment: Post-op Vital signs reviewed  Post-op Vital Signs: Reviewed  Last Vitals:  Filed Vitals:   01/18/15 1630  BP: 149/135  Pulse: 73  Temp:   Resp: 18    Complications: No apparent anesthesia complications

## 2015-01-18 NOTE — Plan of Care (Signed)
Problem: Consults Goal: Diagnosis- Total Joint Replacement Primary Total Knee     

## 2015-01-18 NOTE — H&P (Signed)
TOTAL KNEE ADMISSION H&P  Patient is being admitted for left total knee arthroplasty.  Subjective:  Chief Complaint:left knee pain.  HPI: Shawnay Rice, 69 y.o. female, has a history of pain and functional disability in the left knee due to arthritis and has failed non-surgical conservative treatments for greater than 12 weeks to includeNSAID's and/or analgesics, corticosteriod injections, viscosupplementation injections, flexibility and strengthening excercises, use of assistive devices and activity modification.  Onset of symptoms was gradual, starting 5 years ago with gradually worsening course since that time. The patient noted no past surgery on the left knee(s).  Patient currently rates pain in the left knee(s) at 10 out of 10 with activity. Patient has night pain, worsening of pain with activity and weight bearing, pain that interferes with activities of daily living, pain with passive range of motion, crepitus and joint swelling.  Patient has evidence of subchondral sclerosis, periarticular osteophytes and joint space narrowing by imaging studies. There is no active infection.  Patient Active Problem List   Diagnosis Date Noted  . Osteoarthritis of left knee 01/18/2015   Past Medical History  Diagnosis Date  . Hypertension   . Thyroid disease     was hyperthyroid, had radiation "Graves Disease"  . Atrial fibrillation and flutter   . Hypothyroidism   . Hyperlipidemia   . Asthma   . Arthritis     osteoarthritis  . Prediabetes   . Aortic valve regurgitation, acquired     01/18/14 TEE ([facility]): mild AI, no AS  . Mitral valve regurgitation     Trace MR/TR by 01/18/14 TEE ([facility])  . Obesity (BMI 30-39.9)   . GERD (gastroesophageal reflux disease)   . Lipoma     just under right breast  . Anxiety   . H/O seasonal allergies   . Frequent UTI     hx of - none for a year (as of 01/05/15)  . Anemia     after hysterectomy at age 56  . Family history of adverse reaction to anesthesia    dad said during eye surgery he felt everything    Past Surgical History  Procedure Laterality Date  . Tonsillectomy    . Throat polyps removed  1979  . Vaginal hysterectomy  1976  . Appendectomy  1980    during exploratory surgery for endometriosis  . Back surgery  1981    lumbar disc removed  . Rotator cuff repair Left   . Cardioversion  06/25/13  . Knee arthroscopy w/ meniscal repair Left 2014  . Atrial fibrillation ablation  01/30/14  . Colonoscopy      Prescriptions prior to admission  Medication Sig Dispense Refill Last Dose  . amLODipine (NORVASC) 5 MG tablet Take 5 mg by mouth 2 (two) times daily.   01/18/2015 at 0730  . aspirin 81 MG tablet Take 81 mg by mouth daily.   01/05/15  . atenolol (TENORMIN) 100 MG tablet Take 100 mg by mouth daily.   01/18/2015 at 0730  . esomeprazole (NEXIUM) 40 MG capsule Take 40 mg by mouth every evening.    01/17/2015 at Unknown time  . Fluticasone-Salmeterol (ADVAIR) 250-50 MCG/DOSE AEPB Inhale 1 puff into the lungs 2 (two) times daily as needed (wheezing).   01/18/2015 at 0730  . furosemide (LASIX) 20 MG tablet Take 20 mg by mouth.   01/17/2015 at Unknown time  . levothyroxine (SYNTHROID, LEVOTHROID) 100 MCG tablet Take 100 mcg by mouth daily before breakfast.   01/18/2015 at 0730  . lisinopril (PRINIVIL,ZESTRIL)  5 MG tablet Take 5 mg by mouth daily.   01/17/2015 at Unknown time  . Multiple Vitamin (MULTIVITAMIN) tablet Take 1 tablet by mouth daily.   01/17/2015 at Unknown time  . naproxen sodium (ANAPROX) 220 MG tablet Take 220 mg by mouth 2 (two) times daily as needed (pain).   01/05/15  . rosuvastatin (CRESTOR) 20 MG tablet Take 20 mg by mouth daily.   01/17/2015 at Unknown time   Allergies  Allergen Reactions  . Codeine Other (See Comments)    Made her hyper  . Latex Rash    History  Substance Use Topics  . Smoking status: Former Smoker    Quit date: 01/04/1985  . Smokeless tobacco: Never Used  . Alcohol Use: Yes     Comment: occasional    Family  History  Problem Relation Age of Onset  . Hypertension Mother   . CAD Mother   . Heart attack Mother   . Heart disease Father   . Cancer Father   . High Cholesterol Father   . High Cholesterol Brother   . Hypertension Brother   . High Cholesterol Brother   . Hypertension Brother   . High Cholesterol Daughter   . COPD Daughter   . Crohn's disease Daughter   . Autism spectrum disorder Son      Review of Systems  Musculoskeletal: Positive for joint pain.  All other systems reviewed and are negative.   Objective:  Physical Exam  Constitutional: She is oriented to person, place, and time. She appears well-developed and well-nourished.  HENT:  Head: Normocephalic and atraumatic.  Eyes: EOM are normal. Pupils are equal, round, and reactive to light.  Neck: Normal range of motion. Neck supple.  Cardiovascular: Normal rate and regular rhythm.   Respiratory: Effort normal and breath sounds normal.  GI: Soft. Bowel sounds are normal.  Musculoskeletal:       Left knee: She exhibits decreased range of motion, effusion and abnormal alignment. Tenderness found. Medial joint line and lateral joint line tenderness noted.  Neurological: She is alert and oriented to person, place, and time.  Skin: Skin is warm and dry.  Psychiatric: She has a normal mood and affect.    Vital signs in last 24 hours: Temp:  [97.9 F (36.6 C)] 97.9 F (36.6 C) (05/03 1035) Pulse Rate:  [64] 64 (05/03 1035) Resp:  [21] 21 (05/03 1035) BP: (158)/(62) 158/62 mmHg (05/03 1035) SpO2:  [100 %] 100 % (05/03 1035) Weight:  [80.74 kg (178 lb)] 80.74 kg (178 lb) (05/03 1035)  Labs:   Estimated body mass index is 32.55 kg/(m^2) as calculated from the following:   Height as of 01/05/15: 5\' 2"  (1.575 m).   Weight as of this encounter: 80.74 kg (178 lb).   Imaging Review Plain radiographs demonstrate severe degenerative joint disease of the left knee(s). The overall alignment ismild varus. The bone quality  appears to be excellent for age and reported activity level.  Assessment/Plan:  End stage arthritis, left knee   The patient history, physical examination, clinical judgment of the provider and imaging studies are consistent with end stage degenerative joint disease of the left knee(s) and total knee arthroplasty is deemed medically necessary. The treatment options including medical management, injection therapy arthroscopy and arthroplasty were discussed at length. The risks and benefits of total knee arthroplasty were presented and reviewed. The risks due to aseptic loosening, infection, stiffness, patella tracking problems, thromboembolic complications and other imponderables were discussed. The patient acknowledged the  explanation, agreed to proceed with the plan and consent was signed. Patient is being admitted for inpatient treatment for surgery, pain control, PT, OT, prophylactic antibiotics, VTE prophylaxis, progressive ambulation and ADL's and discharge planning. The patient is planning to be discharged home with home health services

## 2015-01-18 NOTE — Brief Op Note (Signed)
01/18/2015  1:35 PM  PATIENT:  Kim Rice  69 y.o. female  PRE-OPERATIVE DIAGNOSIS:  Left knee osteoarthritis  POST-OPERATIVE DIAGNOSIS:  Left knee osteoarthritis  PROCEDURE:  Procedure(s): LEFT TOTAL KNEE ARTHROPLASTY (Left)  SURGEON:  Surgeon(s) and Role:    * Mcarthur Rossetti, MD - Primary  PHYSICIAN ASSISTANT: Benita Stabile, PA-C  ANESTHESIA:   local, regional and general  EBL:  Total I/O In: 1600 [I.V.:1600] Out: -   BLOOD ADMINISTERED:none  DRAINS: none   LOCAL MEDICATIONS USED:  OTHER Experil  SPECIMEN:  No Specimen  DISPOSITION OF SPECIMEN:  N/A  COUNTS:  YES  TOURNIQUET:   Total Tourniquet Time Documented: Thigh (Left) - 59 minutes Total: Thigh (Left) - 59 minutes   DICTATION: .Other Dictation: Dictation Number [id number]  PLAN OF CARE: Admit to inpatient   PATIENT DISPOSITION:  PACU - hemodynamically stable.   Delay start of Pharmacological VTE agent (>24hrs) due to surgical blood loss or risk of bleeding: no

## 2015-01-18 NOTE — Progress Notes (Signed)
Orthopedic Tech Progress Note Patient Details:  Kim Rice [birth year] [id number]  CPM Left Knee CPM Left Knee: On Left Knee Flexion (Degrees): 90 Left Knee Extension (Degrees): 0  Ortho Devices Ortho Device/Splint Location: applied overhead frame to bed Ortho Device/Splint Interventions: Ordered, Application   Braulio Bosch 01/18/2015, 4:06 PM

## 2015-01-18 NOTE — Anesthesia Preprocedure Evaluation (Signed)
Anesthesia Evaluation  Patient identified by MRN, date of birth, ID band Patient awake    Reviewed: Allergy & Precautions, NPO status , Patient's Chart, lab work & pertinent test results  Airway Mallampati: II   Neck ROM: full    Dental   Pulmonary asthma , former smoker,  breath sounds clear to auscultation        Cardiovascular hypertension, + dysrhythmias Atrial Fibrillation + Valvular Problems/Murmurs MR Rhythm:regular Rate:Normal  S/p cardiac ablation.   Neuro/Psych Anxiety    GI/Hepatic GERD-  ,  Endo/Other  Hypothyroidism obese  Renal/GU      Musculoskeletal  (+) Arthritis -,   Abdominal   Peds  Hematology   Anesthesia Other Findings   Reproductive/Obstetrics                             Anesthesia Physical Anesthesia Plan  ASA: III  Anesthesia Plan: General and Regional   Post-op Pain Management: MAC Combined w/ Regional for Post-op pain   Induction: Intravenous  Airway Management Planned: LMA  Additional Equipment:   Intra-op Plan:   Post-operative Plan:   Informed Consent: I have reviewed the patients History and Physical, chart, labs and discussed the procedure including the risks, benefits and alternatives for the proposed anesthesia with the patient or authorized representative who has indicated his/her understanding and acceptance.     Plan Discussed with: CRNA, Anesthesiologist and Surgeon  Anesthesia Plan Comments:         Anesthesia Quick Evaluation

## 2015-01-19 ENCOUNTER — Encounter (HOSPITAL_COMMUNITY): Payer: Self-pay | Admitting: Orthopaedic Surgery

## 2015-01-19 LAB — CBC
HEMATOCRIT: 31.7 % — AB (ref 36.0–46.0)
Hemoglobin: 10.4 g/dL — ABNORMAL LOW (ref 12.0–15.0)
MCH: 30.1 pg (ref 26.0–34.0)
MCHC: 32.8 g/dL (ref 30.0–36.0)
MCV: 91.9 fL (ref 78.0–100.0)
Platelets: 245 10*3/uL (ref 150–400)
RBC: 3.45 MIL/uL — AB (ref 3.87–5.11)
RDW: 13.4 % (ref 11.5–15.5)
WBC: 11.3 10*3/uL — AB (ref 4.0–10.5)

## 2015-01-19 LAB — BASIC METABOLIC PANEL
ANION GAP: 8 (ref 5–15)
BUN: 6 mg/dL (ref 6–20)
CHLORIDE: 102 mmol/L (ref 101–111)
CO2: 27 mmol/L (ref 22–32)
Calcium: 8.8 mg/dL — ABNORMAL LOW (ref 8.9–10.3)
Creatinine, Ser: 0.73 mg/dL (ref 0.44–1.00)
GFR calc Af Amer: >60 mL/min (ref >60)
GFR calc non Af Amer: >60 mL/min (ref >60)
Glucose, Bld: 158 mg/dL — ABNORMAL HIGH (ref 70–99)
Potassium: 3.8 mmol/L (ref 3.5–5.1)
Sodium: 137 mmol/L (ref 135–145)

## 2015-01-19 NOTE — Care Management Note (Addendum)
Case Management Note  Patient Details  Name: Lacye Mccarn MRN: [id number] Date of Birth: 11-Oct-1945  Subjective/Objective:                    Action/Plan:   Expected Discharge Date:  01/20/15           69 yr old female admitted with osteoarthritis of the left knee. Patient underwent a left total knee arthroplasty. Patient was preoperatively setup with [facility], no changes. Patient has family support at discharge.   .    Expected Discharge Plan: Home with [facility]     In-House Referral:     Discharge planning Services  CM Consult  Post Acute Care Choice:  Durable Medical Equipment, Home Health Choice offered to:  Patient  DME Arranged:  3-N-1, CPM, Walker rolling DME Agency:  [employer]  HH Arranged:  PT HH Agency:  [facility]  Status of Service:  Completed, signed off  Medicare Important Message Given:  N/A - LOS <3 / Initial given by admissions Date Medicare IM Given:    Medicare IM give by:    Date Additional Medicare IM Given:    Additional Medicare Important Message give by:     If discussed at [place] of Stay Meetings, dates discussed:    Additional Comments:  Ninfa Meeker, RN 01/19/2015, 2:04 PM

## 2015-01-19 NOTE — Evaluation (Addendum)
Occupational Therapy Evaluation Patient Details Name: Kim Rice MRN: [id number] DOB: Dec 15, 1945 Today's Date: 01/19/2015    History of Present Illness Patient is a 69 y/o female s/p L TKA. PMH of HTN, A-fib and flutter, HLD, anxiety, mitral valve and aortic valve regurgitation and anemia.   Clinical Impression   Pt s/p above. Education provided in session and pt verbalized understanding. OT signing off.    Follow Up Recommendations  No OT follow up;Supervision - Intermittent    Equipment Recommendations  None recommended by OT    Recommendations for Other Services       Precautions / Restrictions Precautions Precautions: Knee;Fall Precaution Booklet Issued: No Precaution Comments: reviewed knee precautions Required Braces or Orthoses: Knee Immobilizer Restrictions Weight Bearing Restrictions: Yes LLE Weight Bearing: Weight bearing as tolerated      Mobility Bed Mobility Overal bed mobility: Modified Independent  Transfers Overall transfer level: Needs assistance Equipment used: Rolling walker (2 wheeled) Transfers: Sit to/from Stand Sit to Stand: Supervision             Balance No LOB in session-balance not formally assessed.                           ADL Overall ADL's : Needs assistance/impaired                     Lower Body Dressing: Minimal assistance;Sit to/from stand   Toilet Transfer: Supervision/safety;Ambulation;RW (chair)       Tub/ Shower Transfer: Walk-in shower;Supervision/safety;Ambulation;Rolling walker   Functional mobility during ADLs: Supervision/safety;Rolling walker General ADL Comments: Educated on knee immobilizer-pt could not perform left straight raise in session so used knee immobilizer. Educated on safety such as safe footwear, use of bag on walker, sitting for most of LB ADLs, rugs/items on floor and discussed someone being with her for shower transfer and suggested maybe using gait belt (pt has one).  Educated on LB dressing technique and benefit of trying to reach to left foot for LB ADLs as it allows knee to bend. Educated on AE.      Vision     Perception     Praxis      Pertinent Vitals/Pain Pain Assessment: 0-10 Pain Score:  (5-6)-towards end of session (3-4 pain towards beginning of session) Pain Location: left knee Pain Descriptors / Indicators: Sore Pain Intervention(s): Monitored during session;Repositioned     Hand Dominance     Extremity/Trunk Assessment Upper Extremity Assessment Upper Extremity Assessment: Overall WFL for tasks assessed   Lower Extremity Assessment Lower Extremity Assessment: Defer to PT evaluation       Communication Communication Communication: No difficulties   Cognition Arousal/Alertness: Awake/alert Behavior During Therapy: WFL for tasks assessed/performed Overall Cognitive Status: Within Functional Limits for tasks assessed                     General Comments          Shoulder Instructions      Home Living Family/patient expects to be discharged to:: Private residence Living Arrangements: Spouse/significant other Available Help at Discharge: Family;Available 24 hours/day Type of Home: House Home Access: Stairs to enter [employer] of Steps: 2 with no rail vs 5 with rail    Home Layout: One level     Bathroom Shower/Tub: Walk-in shower;Door   [employer]:  (higher than standard)     Home Equipment: Walker - 2 wheels;Cane - single point;Bedside commode;Adaptive equipment Adaptive Equipment:  Reacher        Prior Functioning/Environment Level of Independence: Independent        Comments: husband is disabled    OT Diagnosis: Acute pain   OT Problem List:     OT Treatment/Interventions:      OT Goals(Current goals can be found in the care plan section)   OT Frequency:     Barriers to D/C:            Co-evaluation              End of Session Equipment Utilized During  Treatment: Gait belt;Rolling walker;Left knee immobilizer CPM Left Knee CPM Left Knee: Off Nurse Communication: Other (comment) (OT took coffee to room)  Activity Tolerance: Patient tolerated treatment well Patient left: in chair;with call bell/phone within reach;with family/visitor present   Time: 1639-1700 (subtracted two minutes as pt came in to talk with pt about pain and wearing nasal cannula)   OT Time Calculation (min): 21 min Charges:  OT General Charges $OT Visit: 1 Procedure OT Evaluation $Initial OT Evaluation Tier I: 1 Procedure G-CodesBenito Mccreedy OTR/L [id number] 01/19/2015, 5:16 PM

## 2015-01-19 NOTE — Progress Notes (Signed)
Physical Therapy Treatment Patient Details Name: Kim Rice MRN: [id number] DOB: 07-14-1946 Today's Date: 01/19/2015    History of Present Illness Patient is a 69 y/o female s/p L TKA. PMH of HTN, A-fib and flutter, HLD, anxiety, mitral valve and aortic valve regurgitation and anemia.    PT Comments    Patient is making progress with PT and demonstrated ability to ambulate 75 ft this session.  Pt reports nausea after ambulating 75 ft and requests to return to bed, limiting pt's ambulatory distance.  Patient needs to practice stairs next session.      Follow Up Recommendations  Home health PT;Supervision/Assistance - 24 hour     Equipment Recommendations  None recommended by PT    Recommendations for Other Services       Precautions / Restrictions Precautions Precautions: Knee;Fall Precaution Comments: Reviewed not placing pillow under knee Required Braces or Orthoses: Knee Immobilizer - Left Knee Immobilizer - Left: Other (comment) (No order for KI however pt reports MD says to wear it to kee) Restrictions Weight Bearing Restrictions: Yes LLE Weight Bearing: Weight bearing as tolerated    Mobility  Bed Mobility Overal bed mobility: Modified Independent Bed Mobility: Supine to Sit;Sit to Supine     Supine to sit: Modified independent (Device/Increase time);HOB elevated Sit to supine: Modified independent (Device/Increase time)   General bed mobility comments: Mod I w/ pt utilizing leg hook technique to get into and OOB.  Min use of rails and increased time.  Transfers Overall transfer level: Needs assistance Equipment used: Rolling walker (2 wheeled) Transfers: Sit to/from Stand Sit to Stand: Supervision         General transfer comment: Supervision for safety and verbal cues to remind pt to push from bed.  Ambulation/Gait Ambulation/Gait assistance: Min guard Ambulation Distance (Feet): 75 Feet Assistive device: Rolling walker (2 wheeled) Gait  Pattern/deviations: Step-to pattern;Decreased weight shift to left;Antalgic;Decreased stride length;Trunk flexed   Gait velocity interpretation: Below normal speed for age/gender General Gait Details: Pt w/ step to gait pattern w/ trunk flexion and L hip hike during L swing phase.     Stairs            Wheelchair Mobility    Modified Rankin (Stroke Patients Only)       Balance Overall balance assessment: Needs assistance Sitting-balance support: No upper extremity supported;Feet supported Sitting balance-Leahy Scale: Fair     Standing balance support: Bilateral upper extremity supported;During functional activity Standing balance-Leahy Scale: Fair                      Cognition Arousal/Alertness: Awake/alert Behavior During Therapy: WFL for tasks assessed/performed Overall Cognitive Status: Within Functional Limits for tasks assessed                      Exercises Total Joint Exercises Ankle Circles/Pumps: AROM;Both;10 reps;Supine Quad Sets: AROM;Both;10 reps;Supine Long Arc Quad: AROM;Left;10 reps;Seated Knee Flexion: AROM;Left;5 reps;Seated Goniometric ROM: 11-81    General Comments General comments (skin integrity, edema, etc.): Pt reports nausea after ambulating 35 ft and requests to return to bed.      Pertinent Vitals/Pain Pain Assessment: 0-10 Pain Score: 5  Pain Location: L knee Pain Descriptors / Indicators: Aching;Constant;Dull;Grimacing;Guarding;Heaviness Pain Intervention(s): Limited activity within patient's tolerance;Monitored during session;Repositioned;Ice applied    Home Living Family/patient expects to be discharged to:: Private residence Living Arrangements: Spouse/significant other  Prior Function            PT Goals (current goals can now be found in the care plan section) Acute Rehab PT Goals Patient Stated Goal: to walk Progress towards PT goals: Progressing toward goals    Frequency   7X/week    PT Plan Current plan remains appropriate    Co-evaluation             End of Session Equipment Utilized During Treatment: Gait belt;Left knee immobilizer Activity Tolerance: Patient limited by fatigue;Other (comment) (limited by nausea) Patient left: in bed;with call bell/phone within reach (in CPM)     Time: 2248-2500 PT Time Calculation (min) (ACUTE ONLY): 34 min  Charges:  $Gait Training: 8-22 mins $Therapeutic Exercise: 8-22 mins                    G Codes:      Joslyn Hy PT, [place] [phone] Pager: [phone] 01/19/2015, 3:39 PM

## 2015-01-19 NOTE — Discharge Instructions (Addendum)
Information on my medicine - XARELTO® (Rivaroxaban) ° °This medication education was reviewed with me or my healthcare representative as part of my discharge preparation.  The pharmacist that spoke with me during my hospital stay was:  Dang, Thuy Dien, RPH ° °Why was Xarelto® prescribed for you? °Xarelto® was prescribed for you to reduce the risk of blood clots forming after orthopedic surgery. The medical term for these abnormal blood clots is venous thromboembolism (VTE). ° °What do you need to know about xarelto® ? °Take your Xarelto® ONCE DAILY at the same time every day. °You may take it either with or without food. ° °If you have difficulty swallowing the tablet whole, you may crush it and mix in applesauce just prior to taking your dose. ° °Take Xarelto® exactly as prescribed by your doctor and DO NOT stop taking Xarelto® without talking to the doctor who prescribed the medication.  Stopping without other VTE prevention medication to take the place of Xarelto® may increase your risk of developing a clot. ° °After discharge, you should have regular check-up appointments with your healthcare provider that is prescribing your Xarelto®.   ° °What do you do if you miss a dose? °If you miss a dose, take it as soon as you remember on the same day then continue your regularly scheduled once daily regimen the next day. Do not take two doses of Xarelto® on the same day.  ° °Important Safety Information °A possible side effect of Xarelto® is bleeding. You should call your healthcare provider right away if you experience any of the following: °? Bleeding from an injury or your nose that does not stop. °? Unusual colored urine (red or dark brown) or unusual colored stools (red or black). °? Unusual bruising for unknown reasons. °? A serious fall or if you hit your head (even if there is no bleeding). ° °Some medicines may interact with Xarelto® and might increase your risk of bleeding while on Xarelto®. To help avoid  this, consult your healthcare provider or pharmacist prior to using any new prescription or non-prescription medications, including herbals, vitamins, non-steroidal anti-inflammatory drugs (NSAIDs) and supplements. ° °This website has more information on Xarelto®: www.xarelto.com. ° ° °INSTRUCTIONS AFTER JOINT REPLACEMENT  ° °o Remove items at home which could result in a fall. This includes throw rugs or furniture in walking pathways °o ICE to the affected joint every three hours while awake for 30 minutes at a time, for at least the first 3-5 days, and then as needed for pain and swelling.  Continue to use ice for pain and swelling. You may notice swelling that will progress down to the foot and ankle.  This is normal after surgery.  Elevate your leg when you are not up walking on it.   °o Continue to use the breathing machine you got in the hospital (incentive spirometer) which will help keep your temperature down.  It is common for your temperature to cycle up and down following surgery, especially at night when you are not up moving around and exerting yourself.  The breathing machine keeps your lungs expanded and your temperature down. ° ° °DIET:  As you were doing prior to hospitalization, we recommend a well-balanced diet. ° °DRESSING / WOUND CARE / SHOWERING ° °Keep the surgical dressing until follow up.  The dressing is water proof, so you can shower without any extra covering.  IF THE DRESSING FALLS OFF or the wound gets wet inside, change the dressing with sterile gauze.    Please use good hand washing techniques before changing the dressing.  Do not use any lotions or creams on the incision until instructed by your surgeon.   ° °ACTIVITY ° °o Increase activity slowly as tolerated, but follow the weight bearing instructions below.   °o No driving for 6 weeks or until further direction given by your physician.  You cannot drive while taking narcotics.  °o No lifting or carrying greater than 10 lbs. until  further directed by your surgeon. °o Avoid periods of inactivity such as sitting longer than an hour when not asleep. This helps prevent blood clots.  °o You may return to work once you are authorized by your doctor.  ° ° ° °WEIGHT BEARING  ° °Weight bearing as tolerated with assist device (walker, cane, etc) as directed, use it as long as suggested by your surgeon or therapist, typically at least 4-6 weeks. ° ° °EXERCISES ° °Results after joint replacement surgery are often greatly improved when you follow the exercise, range of motion and muscle strengthening exercises prescribed by your doctor. Safety measures are also important to protect the joint from further injury. Any time any of these exercises cause you to have increased pain or swelling, decrease what you are doing until you are comfortable again and then slowly increase them. If you have problems or questions, call your caregiver or physical therapist for advice.  ° °Rehabilitation is important following a joint replacement. After just a few days of immobilization, the muscles of the leg can become weakened and shrink (atrophy).  These exercises are designed to build up the tone and strength of the thigh and leg muscles and to improve motion. Often times heat used for twenty to thirty minutes before working out will loosen up your tissues and help with improving the range of motion but do not use heat for the first two weeks following surgery (sometimes heat can increase post-operative swelling).  ° °These exercises can be done on a training (exercise) mat, on the floor, on a table or on a bed. Use whatever works the best and is most comfortable for you.    Use music or television while you are exercising so that the exercises are a pleasant break in your day. This will make your life better with the exercises acting as a break in your routine that you can look forward to.   Perform all exercises about fifteen times, three times per day or as directed.   You should exercise both the operative leg and the other leg as well. ° °Exercises include: °  °• Quad Sets - Tighten up the muscle on the front of the thigh (Quad) and hold for 5-10 seconds.   °• Straight Leg Raises - With your knee straight (if you were given a brace, keep it on), lift the leg to 60 degrees, hold for 3 seconds, and slowly lower the leg.  Perform this exercise against resistance later as your leg gets stronger.  °• Leg Slides: Lying on your back, slowly slide your foot toward your buttocks, bending your knee up off the floor (only go as far as is comfortable). Then slowly slide your foot back down until your leg is flat on the floor again.  °• Angel Wings: Lying on your back spread your legs to the side as far apart as you can without causing discomfort.  °• Hamstring Strength:  Lying on your back, push your heel against the floor with your leg straight by tightening up the   muscles of your buttocks.  Repeat, but this time bend your knee to a comfortable angle, and push your heel against the floor.  You may put a pillow under the heel to make it more comfortable if necessary.  ° °A rehabilitation program following joint replacement surgery can speed recovery and prevent re-injury in the future due to weakened muscles. Contact your doctor or a physical therapist for more information on knee rehabilitation.  ° ° °CONSTIPATION ° °Constipation is defined medically as fewer than three stools per week and severe constipation as less than one stool per week.  Even if you have a regular bowel pattern at home, your normal regimen is likely to be disrupted due to multiple reasons following surgery.  Combination of anesthesia, postoperative narcotics, change in appetite and fluid intake all can affect your bowels.  ° °YOU MUST use at least one of the following options; they are listed in order of increasing strength to get the job done.  They are all available over the counter, and you may need to use some,  POSSIBLY even all of these options:   ° °Drink plenty of fluids (prune juice may be helpful) and high fiber foods °Colace 100 mg by mouth twice a day  °Senokot for constipation as directed and as needed Dulcolax (bisacodyl), take with full glass of water  °Miralax (polyethylene glycol) once or twice a day as needed. ° °If you have tried all these things and are unable to have a bowel movement in the first 3-4 days after surgery call either your surgeon or your primary doctor.   ° °If you experience loose stools or diarrhea, hold the medications until you stool forms back up.  If your symptoms do not get better within 1 week or if they get worse, check with your doctor.  If you experience "the worst abdominal pain ever" or develop nausea or vomiting, please contact the office immediately for further recommendations for treatment. ° ° °ITCHING:  If you experience itching with your medications, try taking only a single pain pill, or even half a pain pill at a time.  You can also use Benadryl over the counter for itching or also to help with sleep.  ° °TED HOSE STOCKINGS:  Use stockings on both legs until for at least 2 weeks or as directed by physician office. They may be removed at night for sleeping. ° °MEDICATIONS:  See your medication summary on the “After Visit Summary” that nursing will review with you.  You may have some home medications which will be placed on hold until you complete the course of blood thinner medication.  It is important for you to complete the blood thinner medication as prescribed. ° °PRECAUTIONS:  If you experience chest pain or shortness of breath - call 911 immediately for transfer to the hospital emergency department.  ° °If you develop a fever greater that 101 F, purulent drainage from wound, increased redness or drainage from wound, foul odor from the wound/dressing, or calf pain - CONTACT YOUR SURGEON.   °                                                °FOLLOW-UP APPOINTMENTS:  If  you do not already have a post-op appointment, please call the office for an appointment to be seen by your surgeon.  Guidelines for how   soon to be seen are listed in your “After Visit Summary”, but are typically between 1-4 weeks after surgery. ° °OTHER INSTRUCTIONS:  ° °Knee Replacement:  Do not place pillow under knee, focus on keeping the knee straight while resting. CPM instructions: 0-90 degrees, 2 hours in the morning, 2 hours in the afternoon, and 2 hours in the evening. Place foam block, curve side up under heel at all times except when in CPM or when walking.  DO NOT modify, tear, cut, or change the foam block in any way. ° °MAKE SURE YOU:  °• Understand these instructions.  °• Get help right away if you are not doing well or get worse.  ° ° °Thank you for letting us be a part of your medical care team.  It is a privilege we respect greatly.  We hope these instructions will help you stay on track for a fast and full recovery!  ° °

## 2015-01-19 NOTE — Progress Notes (Signed)
Subjective: 1 Day Post-Op Procedure(s) (LRB): LEFT TOTAL KNEE ARTHROPLASTY (Left) Patient reports pain as moderate.  Nausea this am.  Labs pending.  Objective: Vital signs in last 24 hours: Temp:  [97.9 F (36.6 C)-98.7 F (37.1 C)] 98.7 F (37.1 C) (05/04 0544) Pulse Rate:  [64-87] 87 (05/04 0544) Resp:  [11-22] 17 (05/04 0544) BP: (106-163)/(46-135) 121/64 mmHg (05/04 0544) SpO2:  [90 %-100 %] 95 % (05/04 0544) Weight:  [80.74 kg (178 lb)] 80.74 kg (178 lb) (05/03 1035)  Intake/Output from previous day: 05/03 0701 - 05/04 0700 In: 5465 [P.O.:120; I.V.:1600] Out: -  Intake/Output this shift:    No results for input(s): HGB in the last 72 hours. No results for input(s): WBC, RBC, HCT, PLT in the last 72 hours. No results for input(s): NA, K, CL, CO2, BUN, CREATININE, GLUCOSE, CALCIUM in the last 72 hours. No results for input(s): LABPT, INR in the last 72 hours.  Sensation intact distally Intact pulses distally Dorsiflexion/Plantar flexion intact Incision: dressing C/D/I Compartment soft  Assessment/Plan: 1 Day Post-Op Procedure(s) (LRB): LEFT TOTAL KNEE ARTHROPLASTY (Left) Up with therapy  BLACKMAN,CHRISTOPHER Y 01/19/2015, 7:48 AM

## 2015-01-19 NOTE — Op Note (Signed)
NAMESHALICIA, Rice NO.:  [id number]  MEDICAL RECORD NO.:  [id number]  LOCATION:  5N17C                        FACILITY:  [facility]  PHYSICIAN:  Lind Guest. Ninfa Linden, M.D.DATE OF BIRTH:  [birth year]  DATE OF PROCEDURE:  01/18/2015 DATE OF DISCHARGE:                              OPERATIVE REPORT   PREOPERATIVE DIAGNOSIS:  Primary osteoarthritis and degenerative joint disease, left knee.  POSTOPERATIVE DIAGNOSIS:  Primary osteoarthritis and degenerative joint disease, left knee.  PROCEDURE:  Left total knee arthroplasty.  IMPLANTS:  Stryker Triathlon knee with size 3 femur, size 2 tibial tray, 9 mm fix bearing polyethylene insert, size 29 patellar button.  SURGEON:  Lind Guest. Ninfa Linden, M.D.  ASSISTANT:  Erskine Emery, PA-C.  ANESTHESIA: 1. Left leg adductor canal block/regional. 2. General. 3. Exparel with 20 mL of Exparel mixed with 40 mL of saline intra-     articular injection.  ANTIBIOTICS:  2 g IV Ancef.  TOURNIQUET TIME:  Less than 1 hour.  ESTIMATED BLOOD LOSS:  Less than 100 mL.  COMPLICATIONS:  None.  INDICATIONS:  Kim Rice is a 69 year old female who I have known for at least a decade now.  She has been developing worsening pain in her left knee and has known medial compartment and patellofemoral joint arthritis with mild lateral arthritis.  She does have varus deformity of her knee.  We have tried injections and all forms of conservative treatment.  She developed recurrent effusions and MRI showed areas of full-thickness cartilage loss throughout the knee.  Given the failure of conservative treatment, the impact this had on her activities of daily living, mobility, and quality of life, she does wish to proceed with a total knee arthroplasty.  She understands the risk of acute blood loss anemia, nerve and vessel injury, fracture, infection, DVT.  She understands the goal for decreased pain, improved mobility, and  overall improved quality of life.  DESCRIPTION OF PROCEDURE:  After informed consent was obtained, the appropriate left leg was marked.  Anesthesia obtained regional adductor canal block.  She was then brought to the operating room, placed supine on the operating table.  General anesthesia was then obtained.  A nonsterile tourniquet was placed around her upper left thigh and her left leg was prepped and draped from the thigh down the toes with DuraPrep and sterile drapes including a sterile stockinette.  Time-out was called, she was identified as correct patient and correct left knee. We then used Esmarch to wrap out the leg and tourniquet was inflated to 300 mm of pressure.  I then made a midline incision over the patella and carried this proximally and distally, dissected down the knee joint and carried out a medial parapatellar arthrotomy finding a very large joint effusion.  I found osteophytes from around the knee, we removed these with a rongeur with the knee in a flexed position.  I removed remnants of ACL, PCL, medial and lateral meniscus.  We then used extramedullary tibial cutting guide and set this for 9 mm resection based off the high side, correcting for neutral slope and correcting for varus and valgus. We then made that tibial cut without difficulty.  Using intramedullary drill and guide for the  femur, we set this at 5 degrees externally rotated and made our distal femoral cut of 8 mm.  We brought the leg back down in extension.  I removed remnants of medial and lateral meniscus and then placed a 9 mm extension block, and we had full extension, I was pleased with the extension gap.  We then went back to the femur, and used the femoral sizing guide based off Whiteside's line and the epicondylar axis and chose a size 3 femur.  We then put our 4 in 1 cutting block for a size 3 femur, made our anterior and posterior cuts, followed by our chamfer cuts.  We then made our femoral  box cut. We then went back to the tibia and setting a rotation of the femoral component and the tibial tubercle, we chose a size 2 tibial tray.  We then did our keel punch off this.  With the trial 2 tibia and the trial 3 femur, we placed a 9 mm fix bearing polyethylene insert trial and I was pleased with the stability and range of motion.  We then made our patellar cut and drilled 3 holes for a size 29 patellar button.  I then removed all trial components.  We irrigated the knee with normal saline solution and then placed a mixture of 20 mL of Exparel and 40 mL of saline throughout the arthrotomy.  We then mixed our cement and then cemented the real Stryker Triathlon tibial tray size 2 followed by the real size 3 femur.  We placed the real 9 mm fix bearing polyethylene insert which did get wedged in there and when I removed it, __________ was broken, so we got a new poly and placed a new __________ 9 mm poly. We then cemented our patella button.  Once the cement had dried, we removed cement debris from the knee and hardened completely.  We let the tourniquet down and hemostasis was obtained with electrocautery.  We then irrigated the knee again with normal saline solution using pulsatile lavage.  We closed the arthrotomy with interrupted #1 Vicryl suture followed by 0 Vicryl in the deep tissue, 2-0 Vicryl in subcutaneous tissue, 4-0 Monocryl subcuticular stitch, and Steri-Strips on the skin.  Well-padded sterile dressing was applied.  She was awakened, extubated, and taken to recovery in stable condition.  All final counts were correct.  No complications noted.  Of note, Erskine Emery, PA-C, assisted during the entire case and his assistance was crucial for facilitating all aspects of this case including a layered closure of the wound.     Lind Guest. Ninfa Linden, M.D.     CYB/MEDQ  D:  01/18/2015  T:  01/19/2015  Job:  [id number]

## 2015-01-19 NOTE — Evaluation (Signed)
Physical Therapy Evaluation Patient Details Name: Kim Rice MRN: [id number] DOB: 1946/02/10 Today's Date: 01/19/2015   History of Present Illness  Patient is a 69 y/o female s/p L TKA. PMH of HTN, A-fib and flutter, HLD, anxiety, mitral valve and aortic valve regurgitation and anemia.    Clinical Impression  Patient presents with pain, post surgical deficits LLE and nausea impacting mobility. Pt not able to keep any food down possible contributing to fatigue/weakness. Reviewed HEP and precautions. Pt lives with disabled spouse who can only help some at home. Brother is there at night. Would benefit from skilled PT to improve transfers, gait, balance and mobility so pt can maximize independence and return to PLOF.    Follow Up Recommendations Home health PT;Supervision/Assistance - 24 hour    Equipment Recommendations  None recommended by PT    Recommendations for Other Services       Precautions / Restrictions Precautions Precautions: Knee;Fall Precaution Booklet Issued: Yes (comment) Precaution Comments: Reviewed not placing pillow under knee Required Braces or Orthoses: Knee Immobilizer - Left Knee Immobilizer - Left: Other (comment) (No order for KI however pt reports MD says to wear it to keep knee straight.) Restrictions Weight Bearing Restrictions: Yes LLE Weight Bearing: Weight bearing as tolerated      Mobility  Bed Mobility Overal bed mobility: Needs Assistance Bed Mobility: Supine to Sit     Supine to sit: Min assist;HOB elevated     General bed mobility comments: Min A to bring LLE to EOB. Use of rails. Increased time.  Transfers Overall transfer level: Needs assistance Equipment used: Rolling walker (2 wheeled) Transfers: Sit to/from Stand Sit to Stand: Min guard         General transfer comment: Min guard to stand from EOB x1, from toilet x1. Cues for hand placement.   Ambulation/Gait Ambulation/Gait assistance: Min assist Ambulation Distance  (Feet): 16 Feet (x2 bouts) Assistive device: Rolling walker (2 wheeled) Gait Pattern/deviations: Step-to pattern;Decreased stance time - left;Decreased step length - right;Trunk flexed   Gait velocity interpretation: Below normal speed for age/gender General Gait Details: Pt with step to gait pattern with increased WB through BUEs. + dizziness initially. 1 LOB when entering bathroom due to lip at doorway requiring Min A to prevent fall.  Stairs            Wheelchair Mobility    Modified Rankin (Stroke Patients Only)       Balance Overall balance assessment: Needs assistance Sitting-balance support: Feet supported;No upper extremity supported Sitting balance-Leahy Scale: Fair     Standing balance support: During functional activity Standing balance-Leahy Scale: Fair Standing balance comment: Able to perform dynamic standing activities - washing hands at sink without UE support for short periods.                             Pertinent Vitals/Pain Pain Assessment: Faces Faces Pain Scale: Hurts little more Pain Location: left knee Pain Descriptors / Indicators: Sore Pain Intervention(s): Monitored during session;Repositioned;Limited activity within patient's tolerance    Home Living Family/patient expects to be discharged to:: Private residence Living Arrangements: Spouse/significant other;Other relatives Available Help at Discharge: Family;Available 24 hours/day Type of Home: House Home Access: Stairs to enter   [employer] of Steps: 2 STE no HR vs 5 STE with HR Home Layout: One level Home Equipment: Walker - 2 wheels;Cane - single point;Bedside commode      Prior Function Level of Independence: Independent  Comments: Pt reports her husband is disabled and uses rollator for ambulation and not sure how much he can assist.      Hand Dominance        Extremity/Trunk Assessment   Upper Extremity Assessment: Defer to OT  evaluation           Lower Extremity Assessment: LLE deficits/detail   LLE Deficits / Details: Limited AROM knee flexion/ext secondary to pain. Ankle AROM WFL. KI donned for gait.     Communication   Communication: No difficulties  Cognition Arousal/Alertness: Awake/alert Behavior During Therapy: WFL for tasks assessed/performed Overall Cognitive Status: Within Functional Limits for tasks assessed                      General Comments General comments (skin integrity, edema, etc.): Pt distracted from  + nausea, dizziness, pain. Has not eaten. + emesis earlier in AM.     Exercises Total Joint Exercises Ankle Circles/Pumps: Both;15 reps;Seated Quad Sets: Both;10 reps;Seated Gluteal Sets: Both;10 reps;Seated Heel Slides: AAROM;Left;5 reps;Seated Goniometric ROM: Knee flexion AROM 60 degrees, knee extension AROM lacking 18 degrees.      Assessment/Plan    PT Assessment Patient needs continued PT services  PT Diagnosis Difficulty walking;Acute pain;Generalized weakness   PT Problem List Decreased strength;Pain;Decreased range of motion;Decreased activity tolerance;Decreased balance;Decreased mobility  PT Treatment Interventions Balance training;Gait training;Stair training;Patient/family education;Functional mobility training;Therapeutic activities;Therapeutic exercise   PT Goals (Current goals can be found in the Care Plan section) Acute Rehab PT Goals Patient Stated Goal: to be able to eat something PT Goal Formulation: With patient Time For Goal Achievement: 02/02/15 Potential to Achieve Goals: Good    Frequency BID (if time allows.)   Barriers to discharge Decreased caregiver support Pt lives with spouse who is disabled and brother who is available to assist at night.    Co-evaluation               End of Session Equipment Utilized During Treatment: Gait belt;Left knee immobilizer Activity Tolerance: Patient limited by pain;Other (comment);Patient  limited by fatigue (nausea) Patient left: in chair;with call bell/phone within reach Nurse Communication: Mobility status         Time: 9323-5573 PT Time Calculation (min) (ACUTE ONLY): 28 min   Charges:   PT Evaluation $Initial PT Evaluation Tier I: 1 Procedure PT Treatments $Therapeutic Activity: 8-22 mins   PT G CodesCandy Sledge A Jan 29, 2015, 10:44 AM Wray Kearns, PT, DPT [phone]

## 2015-01-20 LAB — CBC
HEMATOCRIT: 30.4 % — AB (ref 36.0–46.0)
Hemoglobin: 10.1 g/dL — ABNORMAL LOW (ref 12.0–15.0)
MCH: 30.4 pg (ref 26.0–34.0)
MCHC: 33.2 g/dL (ref 30.0–36.0)
MCV: 91.6 fL (ref 78.0–100.0)
Platelets: 220 10*3/uL (ref 150–400)
RBC: 3.32 MIL/uL — AB (ref 3.87–5.11)
RDW: 13.4 % (ref 11.5–15.5)
WBC: 10.8 10*3/uL — AB (ref 4.0–10.5)

## 2015-01-20 MED ORDER — RIVAROXABAN 10 MG PO TABS: 10.0000 mg | ORAL_TABLET | Freq: Every day | ORAL | Status: DC

## 2015-01-20 MED ORDER — METHOCARBAMOL 500 MG PO TABS: 500.0000 mg | ORAL_TABLET | Freq: Four times a day (QID) | ORAL | Status: DC | PRN

## 2015-01-20 MED ORDER — OXYCODONE-ACETAMINOPHEN 5-325 MG PO TABS: 1.0000 | ORAL_TABLET | ORAL | Status: DC | PRN

## 2015-01-20 NOTE — Clinical Documentation Improvement (Signed)
   Per Radiology report Jan 18, 2015: "LEFT Femoral and tibial components appear well-positioned. Subcutaneous emphysema. No surgical drain is observed"  Do you agree with radiologist report that subcutaneous emphysema was likely present post op?  Carrolyn Meiers, RN [email or web address] [phone]

## 2015-01-20 NOTE — Discharge Summary (Signed)
Patient ID: Kim Rice MRN: [id number] DOB/AGE: 1946/06/30 69 y.o.  Admit date: 01/18/2015 Discharge date: 01/20/2015  Admission Diagnoses:  Principal Problem:   Osteoarthritis of left knee Active Problems:   Status post total left knee replacement   Discharge Diagnoses:  Same  Past Medical History  Diagnosis Date  . Hypertension   . Thyroid disease     was hyperthyroid, had radiation "Graves Disease"  . Atrial fibrillation and flutter   . Hypothyroidism   . Hyperlipidemia   . Asthma   . Arthritis     osteoarthritis  . Prediabetes   . Aortic valve regurgitation, acquired     01/18/14 TEE ([facility]): mild AI, no AS  . Mitral valve regurgitation     Trace MR/TR by 01/18/14 TEE ([facility])  . Obesity (BMI 30-39.9)   . GERD (gastroesophageal reflux disease)   . Lipoma     just under right breast  . Anxiety   . H/O seasonal allergies   . Frequent UTI     hx of - none for a year (as of 01/05/15)  . Anemia     after hysterectomy at age 17  . Family history of adverse reaction to anesthesia     dad said during eye surgery he felt everything    Surgeries: Procedure(s): LEFT TOTAL KNEE ARTHROPLASTY on 01/18/2015   Consultants:    Discharged Condition: Improved  Hospital Course: Kim Rice is an 69 y.o. female who was admitted 01/18/2015 for operative treatment ofOsteoarthritis of left knee. Patient has severe unremitting pain that affects sleep, daily activities, and work/hobbies. After pre-op clearance the patient was taken to the operating room on 01/18/2015 and underwent  Procedure(s): LEFT TOTAL KNEE ARTHROPLASTY.    Patient was given perioperative antibiotics: Anti-infectives    Start     Dose/Rate Route Frequency Ordered Stop   01/18/15 2100  ceFAZolin (ANCEF) IVPB 1 g/50 mL premix     1 g 100 mL/hr over 30 Minutes Intravenous Every 6 hours 01/18/15 1951 01/19/15 0308   01/18/15 0600  ceFAZolin (ANCEF) IVPB 2 g/50 mL premix     2 g 100 mL/hr over 30 Minutes Intravenous  On call to O.R. 01/17/15 1449 01/18/15 1208       Patient was given sequential compression devices, early ambulation, and chemoprophylaxis to prevent DVT.  Patient benefited maximally from hospital stay and there were no complications.    Recent vital signs: Patient Vitals for the past 24 hrs:  BP Temp Pulse Resp SpO2  01/20/15 0507 (!) 136/52 mmHg 98.9 F (37.2 C) 84 18 90 %  01/19/15 1943 (!) 138/51 mmHg 100.2 F (37.9 C) 89 18 90 %  01/19/15 1500 - - - - (!) 89 %  01/19/15 1359 (!) 130/53 mmHg 99 F (37.2 C) 82 - 97 %  01/19/15 1138 (!) 136/53 mmHg - 75 - 97 %     Recent laboratory studies:  Recent Labs  01/19/15 1255 01/20/15 0446  WBC 11.3* 10.8*  HGB 10.4* 10.1*  HCT 31.7* 30.4*  PLT 245 220  NA 137  --   K 3.8  --   CL 102  --   CO2 27  --   BUN 6  --   CREATININE 0.73  --   GLUCOSE 158*  --   CALCIUM 8.8*  --      Discharge Medications:     Medication List    TAKE these medications        amLODipine 5 MG tablet  Commonly known as:  NORVASC  Take 5 mg by mouth 2 (two) times daily.     aspirin 81 MG tablet  Take 81 mg by mouth daily.     atenolol 100 MG tablet  Commonly known as:  TENORMIN  Take 100 mg by mouth daily.     esomeprazole 40 MG capsule  Commonly known as:  NEXIUM  Take 40 mg by mouth every evening.     Fluticasone-Salmeterol 250-50 MCG/DOSE Aepb  Commonly known as:  ADVAIR  Inhale 1 puff into the lungs 2 (two) times daily as needed (wheezing).     furosemide 20 MG tablet  Commonly known as:  LASIX  Take 20 mg by mouth.     levothyroxine 100 MCG tablet  Commonly known as:  SYNTHROID, LEVOTHROID  Take 100 mcg by mouth daily before breakfast.     lisinopril 5 MG tablet  Commonly known as:  PRINIVIL,ZESTRIL  Take 5 mg by mouth daily.     methocarbamol 500 MG tablet  Commonly known as:  ROBAXIN  Take 1 tablet (500 mg total) by mouth every 6 (six) hours as needed for muscle spasms.     multivitamin tablet  Take 1 tablet by  mouth daily.     naproxen sodium 220 MG tablet  Commonly known as:  ANAPROX  Take 220 mg by mouth 2 (two) times daily as needed (pain).     oxyCODONE-acetaminophen 5-325 MG per tablet  Commonly known as:  ROXICET  Take 1-2 tablets by mouth every 4 (four) hours as needed.     rivaroxaban 10 MG Tabs tablet  Commonly known as:  XARELTO  Take 1 tablet (10 mg total) by mouth daily with breakfast.     rosuvastatin 20 MG tablet  Commonly known as:  CRESTOR  Take 20 mg by mouth daily.        Diagnostic Studies: Dg Knee Left Port  01/18/2015   CLINICAL DATA:  Status post total knee replacement.  EXAM: PORTABLE LEFT KNEE - 1-2 VIEW  COMPARISON:  None.  FINDINGS: LEFT Femoral and tibial components appear well-positioned. Subcutaneous emphysema. No surgical drain is observed.  IMPRESSION: Satisfactory appearing LEFT TKR.   Electronically Signed   By: Rolla Flatten M.D.   On: 01/18/2015 14:52    Disposition: to home      Discharge Instructions    Call MD / Call 911    Complete by:  As directed   If you experience chest pain or shortness of breath, CALL 911 and be transported to the hospital emergency room.  If you develope a fever above 101 F, pus (white drainage) or increased drainage or redness at the wound, or calf pain, call your surgeon's office.     Constipation Prevention    Complete by:  As directed   Drink plenty of fluids.  Prune juice may be helpful.  You may use a stool softener, such as Colace (over the counter) 100 mg twice a day.  Use MiraLax (over the counter) for constipation as needed.     Diet - low sodium heart healthy    Complete by:  As directed      Discharge instructions    Complete by:  As directed   Do take an over-the-counter stool softener twice daily     Discharge patient    Complete by:  As directed      Increase activity slowly as tolerated    Complete by:  As directed  Follow-up Information    Follow up with [facility].   Why:   Someone from [facility] will contact you concerning start date and time for therapy.   Contact information:   [address] [place] [address] [phone]       Follow up with Mcarthur Rossetti, MD In 2 weeks.   Specialty:  Orthopedic Surgery   Contact information:   [place] [address] [phone]        Signed: Mcarthur Rossetti 01/20/2015, 7:12 AM

## 2015-01-20 NOTE — Progress Notes (Signed)
Physical Therapy Treatment Patient Details Name: Kim Rice MRN: [id number] DOB: [birth year] Today's Date: 01/20/2015    History of Present Illness Patient is a 69 y/o female s/p L TKA. PMH of HTN, A-fib and flutter, HLD, anxiety, mitral valve and aortic valve regurgitation and anemia.    PT Comments    Patient is making good progress with PT.  From a mobility standpoint anticipate patient will be ready for DC home today.  Pt demonstrated ability to ambulate 200 ft and ascend/descend 5 steps this session.  Pt reports she feels comfortable navigating around her home and does not feel as though she needs to practice anything else w/ PT before returning home.    Follow Up Recommendations  Home health PT;Supervision/Assistance - 24 hour     Equipment Recommendations  None recommended by PT    Recommendations for Other Services       Precautions / Restrictions Precautions Precautions: Knee;Fall Precaution Comments: reviewed knee precautions Required Braces or Orthoses: Knee Immobilizer - Left Knee Immobilizer - Left: Other (comment) (No order for KI however pt reports MD says to wear it to kee) Restrictions Weight Bearing Restrictions: Yes LLE Weight Bearing: Weight bearing as tolerated    Mobility  Bed Mobility Overal bed mobility: Modified Independent Bed Mobility: Supine to Sit;Sit to Supine     Supine to sit: Modified independent (Device/Increase time) Sit to supine: Modified independent (Device/Increase time)   General bed mobility comments: Leg hook technique, no use of bed rails, good carryover from last session  Transfers Overall transfer level: Needs assistance Equipment used: Rolling walker (2 wheeled) Transfers: Sit to/from Stand Sit to Stand: Supervision         General transfer comment: Supervision for safety.  Reminded pt that she needs to have RW ready in front of her when she is at home before she stands up  Ambulation/Gait Ambulation/Gait  assistance: Supervision Ambulation Distance (Feet): 200 Feet Assistive device: Rolling walker (2 wheeled) Gait Pattern/deviations: Step-to pattern;Step-through pattern;Trunk flexed;Decreased weight shift to left;Decreased stance time - left;Antalgic     General Gait Details: Slight trunk flexion w/ R stance phase which improves w/ verbal cues to stand upright   Stairs Stairs: Yes Stairs assistance: Min guard Stair Management: One rail Left;Step to pattern;Sideways Number of Stairs: 5 General stair comments: Cues for sequencing  Wheelchair Mobility    Modified Rankin (Stroke Patients Only)       Balance Overall balance assessment: Needs assistance Sitting-balance support: Feet supported;No upper extremity supported Sitting balance-Leahy Scale: Good     Standing balance support: Bilateral upper extremity supported;During functional activity Standing balance-Leahy Scale: Fair                      Cognition Arousal/Alertness: Awake/alert Behavior During Therapy: WFL for tasks assessed/performed Overall Cognitive Status: Within Functional Limits for tasks assessed                      Exercises Total Joint Exercises Ankle Circles/Pumps: AROM;Both;10 reps;Supine Quad Sets: AROM;Both;10 reps;Supine Straight Leg Raises: AROM;Both;10 reps;Supine Knee Flexion: AROM;Left;5 reps;Seated Goniometric ROM: 8-80    General Comments        Pertinent Vitals/Pain Pain Assessment: 0-10 Pain Score: 4  Pain Location: L knee Pain Descriptors / Indicators: Aching Pain Intervention(s): Limited activity within patient's tolerance;Monitored during session;Repositioned    Home Living                      Prior  Function            PT Goals (current goals can now be found in the care plan section) Acute Rehab PT Goals Patient Stated Goal: to practice stairs Progress towards PT goals: Progressing toward goals    Frequency  7X/week    PT Plan Current  plan remains appropriate    Co-evaluation             End of Session Equipment Utilized During Treatment: Gait belt;Left knee immobilizer Activity Tolerance: Patient tolerated treatment well Patient left: in bed;with call bell/phone within reach     Time: 1357-1411 PT Time Calculation (min) (ACUTE ONLY): 14 min  Charges:  $Gait Training: 8-22 mins                    G Codes:      Joslyn Hy PT, [place] [phone] Pager: [phone] 01/20/2015, 2:44 PM

## 2015-01-20 NOTE — Progress Notes (Signed)
Subjective: 2 Days Post-Op Procedure(s) (LRB): LEFT TOTAL KNEE ARTHROPLASTY (Left) Patient reports pain as moderate.  Working well with therapy.  Asymptomatic acute blood loss anemia.  Objective: Vital signs in last 24 hours: Temp:  [98.9 F (37.2 C)-100.2 F (37.9 C)] 98.9 F (37.2 C) (05/05 0507) Pulse Rate:  [75-89] 84 (05/05 0507) Resp:  [18] 18 (05/05 0507) BP: (130-138)/(51-53) 136/52 mmHg (05/05 0507) SpO2:  [89 %-97 %] 90 % (05/05 0507)  Intake/Output from previous day: 05/04 0701 - 05/05 0700 In: 2730 [P.O.:915; I.V.:1815] Out: -  Intake/Output this shift:     Recent Labs  01/19/15 1255 01/20/15 0446  HGB 10.4* 10.1*    Recent Labs  01/19/15 1255 01/20/15 0446  WBC 11.3* 10.8*  RBC 3.45* 3.32*  HCT 31.7* 30.4*  PLT 245 220    Recent Labs  01/19/15 1255  NA 137  K 3.8  CL 102  CO2 27  BUN 6  CREATININE 0.73  GLUCOSE 158*  CALCIUM 8.8*   No results for input(s): LABPT, INR in the last 72 hours.  Sensation intact distally Intact pulses distally Dorsiflexion/Plantar flexion intact Incision: scant drainage No cellulitis present Compartment soft  Assessment/Plan: 2 Days Post-Op Procedure(s) (LRB): LEFT TOTAL KNEE ARTHROPLASTY (Left) Up with therapy Plan for discharge tomorrow  Mcarthur Rossetti 01/20/2015, 7:07 AM

## 2015-01-20 NOTE — Progress Notes (Signed)
Patient ID: Kim Rice, female   DOB: [birth year], 69 y.o.   MRN: [id number] After talking with the patient more, she would like to be able to go home this afternoon.

## 2016-01-17 DIAGNOSIS — I10 Essential (primary) hypertension: Secondary | ICD-10-CM | POA: Insufficient documentation

## 2016-01-17 DIAGNOSIS — I340 Nonrheumatic mitral (valve) insufficiency: Secondary | ICD-10-CM | POA: Insufficient documentation

## 2016-01-17 DIAGNOSIS — K219 Gastro-esophageal reflux disease without esophagitis: Secondary | ICD-10-CM | POA: Insufficient documentation

## 2016-01-17 DIAGNOSIS — E210 Primary hyperparathyroidism: Secondary | ICD-10-CM

## 2016-01-17 DIAGNOSIS — E039 Hypothyroidism, unspecified: Secondary | ICD-10-CM | POA: Insufficient documentation

## 2016-01-17 DIAGNOSIS — I480 Paroxysmal atrial fibrillation: Secondary | ICD-10-CM

## 2016-01-17 DIAGNOSIS — F419 Anxiety disorder, unspecified: Secondary | ICD-10-CM | POA: Insufficient documentation

## 2016-01-17 DIAGNOSIS — F329 Major depressive disorder, single episode, unspecified: Secondary | ICD-10-CM

## 2016-01-17 DIAGNOSIS — Z8639 Personal history of other endocrine, nutritional and metabolic disease: Secondary | ICD-10-CM

## 2016-01-17 DIAGNOSIS — B354 Tinea corporis: Secondary | ICD-10-CM

## 2016-01-17 DIAGNOSIS — Z6835 Body mass index (BMI) 35.0-35.9, adult: Secondary | ICD-10-CM | POA: Insufficient documentation

## 2016-01-17 DIAGNOSIS — M1990 Unspecified osteoarthritis, unspecified site: Secondary | ICD-10-CM

## 2016-01-17 DIAGNOSIS — Z79899 Other long term (current) drug therapy: Secondary | ICD-10-CM

## 2016-01-17 DIAGNOSIS — E6609 Other obesity due to excess calories: Secondary | ICD-10-CM | POA: Insufficient documentation

## 2016-01-17 DIAGNOSIS — Z780 Asymptomatic menopausal state: Secondary | ICD-10-CM

## 2016-01-17 DIAGNOSIS — E782 Mixed hyperlipidemia: Secondary | ICD-10-CM | POA: Insufficient documentation

## 2016-01-17 DIAGNOSIS — R7303 Prediabetes: Secondary | ICD-10-CM | POA: Insufficient documentation

## 2016-01-17 DIAGNOSIS — D179 Benign lipomatous neoplasm, unspecified: Secondary | ICD-10-CM | POA: Insufficient documentation

## 2016-01-17 DIAGNOSIS — F330 Major depressive disorder, recurrent, mild: Secondary | ICD-10-CM | POA: Insufficient documentation

## 2016-01-17 DIAGNOSIS — J45909 Unspecified asthma, uncomplicated: Secondary | ICD-10-CM | POA: Insufficient documentation

## 2016-01-17 HISTORY — DX: Personal history of other endocrine, nutritional and metabolic disease: Z86.39

## 2016-01-17 HISTORY — DX: Paroxysmal atrial fibrillation: I48.0

## 2016-01-17 HISTORY — DX: Tinea corporis: B35.4

## 2016-01-17 HISTORY — DX: Asymptomatic menopausal state: Z78.0

## 2016-01-17 HISTORY — DX: Nonrheumatic mitral (valve) insufficiency: I34.0

## 2016-01-17 HISTORY — DX: Other obesity due to excess calories: E66.09

## 2016-01-17 HISTORY — DX: Other long term (current) drug therapy: Z79.899

## 2016-01-17 HISTORY — DX: Unspecified osteoarthritis, unspecified site: M19.90

## 2016-01-17 HISTORY — DX: Major depressive disorder, single episode, unspecified: F32.9

## 2016-01-17 HISTORY — DX: Primary hyperparathyroidism: E21.0

## 2016-01-17 HISTORY — DX: Mixed hyperlipidemia: E78.2

## 2016-01-17 HISTORY — DX: Essential (primary) hypertension: I10

## 2016-02-29 DIAGNOSIS — M545 Low back pain, unspecified: Secondary | ICD-10-CM | POA: Insufficient documentation

## 2016-02-29 DIAGNOSIS — G8929 Other chronic pain: Secondary | ICD-10-CM

## 2016-02-29 DIAGNOSIS — M546 Pain in thoracic spine: Secondary | ICD-10-CM | POA: Insufficient documentation

## 2016-02-29 HISTORY — DX: Low back pain, unspecified: M54.50

## 2016-02-29 HISTORY — DX: Other chronic pain: G89.29

## 2016-02-29 HISTORY — DX: Pain in thoracic spine: M54.6

## 2016-09-14 DIAGNOSIS — Z9289 Personal history of other medical treatment: Secondary | ICD-10-CM

## 2016-09-14 HISTORY — DX: Personal history of other medical treatment: Z92.89

## 2016-09-23 IMAGING — CR DG KNEE 1-2V PORT*L*
2 series · 2 of 2 positions shown · non-contrast
Comparison: None.

CLINICAL DATA: Status post total knee replacement.

EXAM:
PORTABLE LEFT KNEE - 1-2 VIEW

[AP]
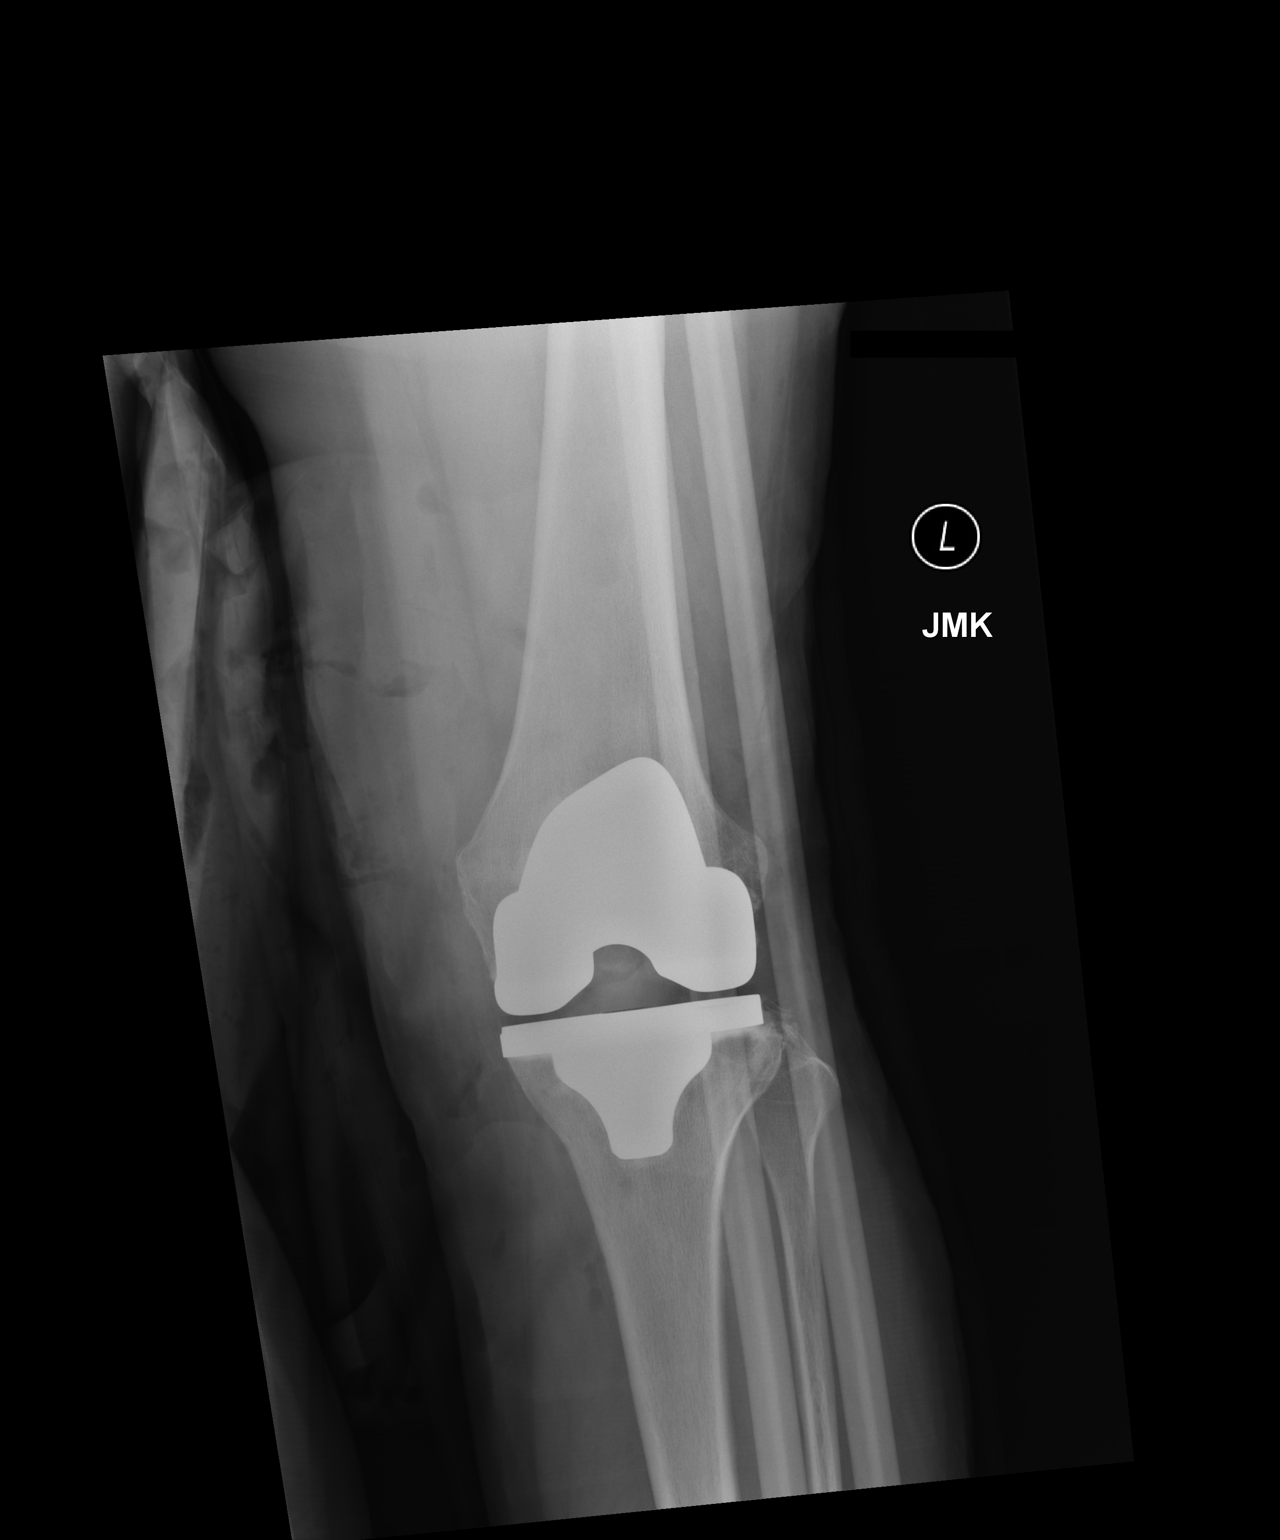

[xtable lateral]
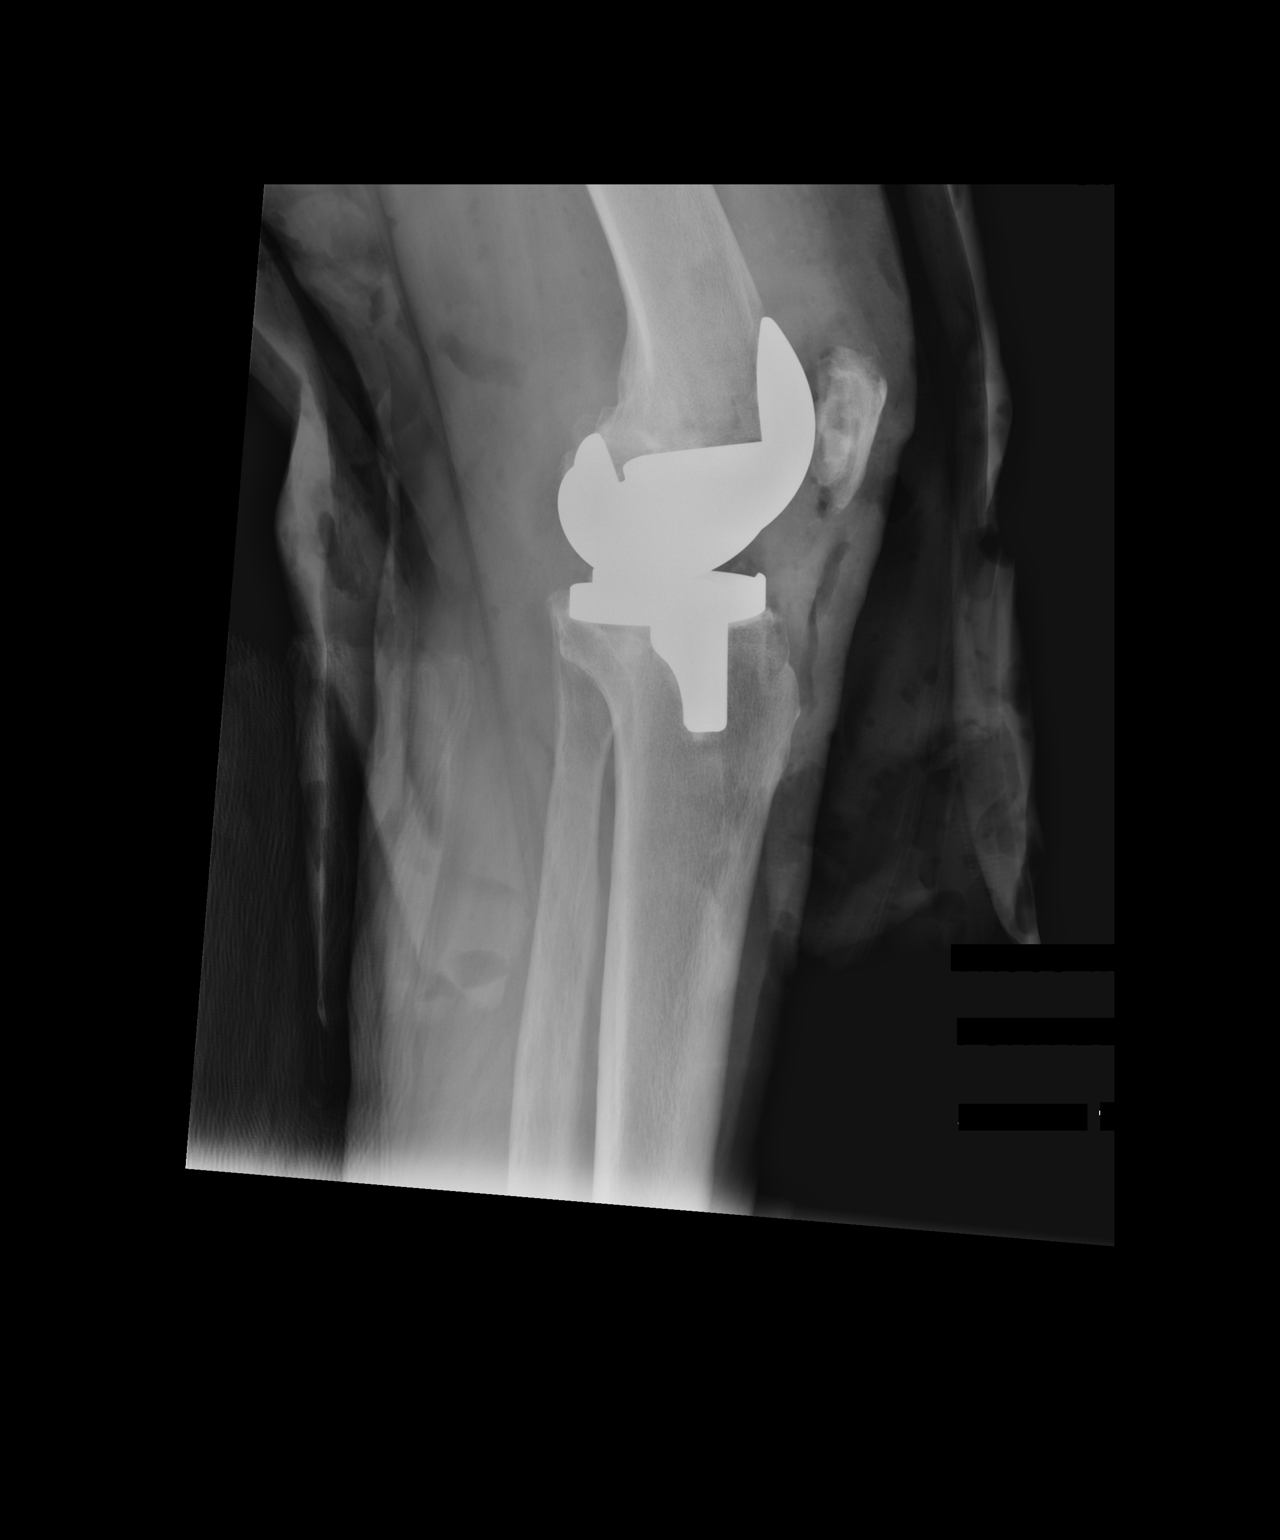

[2 of 2 positions shown; findings below may reference images not displayed]

FINDINGS: LEFT Femoral and tibial components appear well-positioned.
Subcutaneous emphysema. No surgical drain is observed.
IMPRESSION: Satisfactory appearing LEFT TKR.

## 2016-12-13 ENCOUNTER — Telehealth (INDEPENDENT_AMBULATORY_CARE_PROVIDER_SITE_OTHER): Payer: Self-pay | Admitting: Orthopaedic Surgery

## 2016-12-13 ENCOUNTER — Other Ambulatory Visit (INDEPENDENT_AMBULATORY_CARE_PROVIDER_SITE_OTHER): Payer: Self-pay

## 2016-12-13 MED ORDER — TIZANIDINE HCL 4 MG PO CAPS: 4.0000 mg | ORAL_CAPSULE | Freq: Two times a day (BID) | ORAL | 1 refills | Status: DC | PRN

## 2016-12-13 NOTE — Telephone Encounter (Signed)
Please advise 

## 2016-12-13 NOTE — Telephone Encounter (Signed)
Send in Zanaflex 4 mg  twice daily as needed for spasms, #60, no refills and tell her I'm thinking about her and her husband Al.

## 2016-12-13 NOTE — Telephone Encounter (Signed)
Patient aware of the below message  

## 2016-12-13 NOTE — Progress Notes (Unsigned)
zanafle ?

## 2016-12-13 NOTE — Telephone Encounter (Signed)
Patient called needing Rx refilled for back spasm. Patient said she do not remember the name of the medication.  Patient also wanted Dr Ninfa Linden to know that her husband passed away on 11/14/2022.  Patient said she uses [facility] in [place]. The ph# is [phone]. The number to contact patient is [phone]

## 2017-10-18 DIAGNOSIS — R9439 Abnormal result of other cardiovascular function study: Secondary | ICD-10-CM | POA: Insufficient documentation

## 2017-10-18 DIAGNOSIS — Z8679 Personal history of other diseases of the circulatory system: Secondary | ICD-10-CM

## 2017-10-18 DIAGNOSIS — Z9889 Other specified postprocedural states: Secondary | ICD-10-CM

## 2017-10-18 DIAGNOSIS — I350 Nonrheumatic aortic (valve) stenosis: Secondary | ICD-10-CM

## 2017-10-18 HISTORY — DX: Other specified postprocedural states: Z98.890

## 2017-10-18 HISTORY — DX: Personal history of other diseases of the circulatory system: Z86.79

## 2017-10-18 HISTORY — DX: Nonrheumatic aortic (valve) stenosis: I35.0

## 2017-10-18 HISTORY — DX: Abnormal result of other cardiovascular function study: R94.39

## 2018-06-06 DIAGNOSIS — M5030 Other cervical disc degeneration, unspecified cervical region: Secondary | ICD-10-CM

## 2018-06-06 HISTORY — DX: Other cervical disc degeneration, unspecified cervical region: M50.30

## 2019-09-10 DIAGNOSIS — I10 Essential (primary) hypertension: Secondary | ICD-10-CM | POA: Diagnosis not present

## 2019-09-10 DIAGNOSIS — I340 Nonrheumatic mitral (valve) insufficiency: Secondary | ICD-10-CM | POA: Diagnosis not present

## 2019-09-10 DIAGNOSIS — E785 Hyperlipidemia, unspecified: Secondary | ICD-10-CM | POA: Diagnosis not present

## 2019-09-10 DIAGNOSIS — I352 Nonrheumatic aortic (valve) stenosis with insufficiency: Secondary | ICD-10-CM | POA: Diagnosis not present

## 2019-09-10 DIAGNOSIS — I4891 Unspecified atrial fibrillation: Secondary | ICD-10-CM | POA: Diagnosis not present

## 2019-09-10 DIAGNOSIS — I361 Nonrheumatic tricuspid (valve) insufficiency: Secondary | ICD-10-CM | POA: Diagnosis not present

## 2019-09-10 DIAGNOSIS — G459 Transient cerebral ischemic attack, unspecified: Secondary | ICD-10-CM | POA: Diagnosis not present

## 2019-09-11 DIAGNOSIS — G459 Transient cerebral ischemic attack, unspecified: Secondary | ICD-10-CM | POA: Diagnosis not present

## 2019-09-11 DIAGNOSIS — I10 Essential (primary) hypertension: Secondary | ICD-10-CM | POA: Diagnosis not present

## 2019-09-11 DIAGNOSIS — I480 Paroxysmal atrial fibrillation: Secondary | ICD-10-CM | POA: Diagnosis not present

## 2019-09-11 DIAGNOSIS — I4891 Unspecified atrial fibrillation: Secondary | ICD-10-CM | POA: Diagnosis not present

## 2019-09-15 DIAGNOSIS — Z8673 Personal history of transient ischemic attack (TIA), and cerebral infarction without residual deficits: Secondary | ICD-10-CM | POA: Insufficient documentation

## 2019-09-15 DIAGNOSIS — I700 Atherosclerosis of aorta: Secondary | ICD-10-CM | POA: Insufficient documentation

## 2019-09-15 DIAGNOSIS — G459 Transient cerebral ischemic attack, unspecified: Secondary | ICD-10-CM | POA: Insufficient documentation

## 2019-09-15 HISTORY — DX: Transient cerebral ischemic attack, unspecified: G45.9

## 2019-09-15 HISTORY — DX: Atherosclerosis of aorta: I70.0

## 2019-09-25 ENCOUNTER — Telehealth: Payer: Self-pay | Admitting: Cardiology

## 2019-09-25 NOTE — Telephone Encounter (Signed)
Wants samples of eliquis °

## 2019-09-25 NOTE — Telephone Encounter (Signed)
Called patient back. She reports she recently saw Dr.Krasowski at [facility] and was start on eliquis 5 mg twice daily. Patient needs samples because it is expensive. Confirmed this is okay with Dr. Agustin Cree. Patient informed that samples will be ready for her at [facility] office she verbally understands and will pick them up today.

## 2019-09-30 ENCOUNTER — Ambulatory Visit (INDEPENDENT_AMBULATORY_CARE_PROVIDER_SITE_OTHER): Payer: Medicare Other | Admitting: Cardiology

## 2019-09-30 ENCOUNTER — Encounter: Payer: Self-pay | Admitting: Cardiology

## 2019-09-30 ENCOUNTER — Other Ambulatory Visit: Payer: Self-pay

## 2019-09-30 DIAGNOSIS — I4819 Other persistent atrial fibrillation: Secondary | ICD-10-CM | POA: Insufficient documentation

## 2019-09-30 DIAGNOSIS — I340 Nonrheumatic mitral (valve) insufficiency: Secondary | ICD-10-CM

## 2019-09-30 DIAGNOSIS — Z8679 Personal history of other diseases of the circulatory system: Secondary | ICD-10-CM

## 2019-09-30 DIAGNOSIS — Z9889 Other specified postprocedural states: Secondary | ICD-10-CM

## 2019-09-30 DIAGNOSIS — G459 Transient cerebral ischemic attack, unspecified: Secondary | ICD-10-CM | POA: Diagnosis not present

## 2019-09-30 HISTORY — DX: Other specified postprocedural states: Z98.890

## 2019-09-30 HISTORY — DX: Personal history of other diseases of the circulatory system: Z86.79

## 2019-09-30 HISTORY — DX: Other persistent atrial fibrillation: I48.19

## 2019-09-30 NOTE — Addendum Note (Signed)
Addended by: Ashok Norris on: 09/30/2019 09:13 AM   Modules accepted: Orders

## 2019-09-30 NOTE — Patient Instructions (Signed)
Medication Instructions:  Your physician recommends that you continue on your current medications as directed. Please refer to the Current Medication list given to you today.  *If you need a refill on your cardiac medications before your next appointment, please call your pharmacy*  Lab Work: None.  If you have labs (blood work) drawn today and your tests are completely normal, you will receive your results only by: Marland Kitchen MyChart Message (if you have MyChart) OR . A paper copy in the mail If you have any lab test that is abnormal or we need to change your treatment, we will call you to review the results.  Testing/Procedures: Your physician has recommended that you wear a holter monitor. Holter monitors are medical devices that record the heart's electrical activity. Doctors most often use these monitors to diagnose arrhythmias. Arrhythmias are problems with the speed or rhythm of the heartbeat. The monitor is a small, portable device. You can wear one while you do your normal daily activities. This is usually used to diagnose what is causing palpitations/syncope (passing out). Wear for 7 days     Follow-Up: At [facility], you and your health needs are our priority.  As part of our continuing mission to provide you with exceptional heart care, we have created designated Provider Care Teams.  These Care Teams include your primary Cardiologist (physician) and Advanced Practice Providers (APPs -  Physician Assistants and Nurse Practitioners) who all work together to provide you with the care you need, when you need it.  Your next appointment:   1 month(s)  The format for your next appointment:   In Person  Provider:   Jenne Campus, MD  Other Instructions

## 2019-09-30 NOTE — Progress Notes (Signed)
Cardiology Office Note:    Date:  09/30/2019   ID:  Kim Rice, DOB 1946-01-31, MRN [id number]  PCP:  Raina Mina., MD  Cardiologist:  Jenne Campus, MD    Referring MD: Raina Mina., MD   Chief Complaint  Patient presents with  . Hospitalization Follow-up    History of Present Illness:    Kim Rice is a 74 y.o. female surgical history significant paroxysmal atrial fibrillation, status post atrial fibrillation ablation done almost 10 years ago after that her anticoagulation has been withdrawn.  Done about a week ago she presented to [facility] with aphasia as well as unilateral weakness.  Likely old her symptoms very quickly returned almost to baseline.  When I saw her first time in the hospital she got some difficulty finding words but otherwise she was functioning well.  She did have evaluation by my neurology, there was no indication for TPA.  Eventually final diagnosis was that she had TIA.  Interestingly she was found to be back in atrial fibrillation with relatively controlled ventricular rate.  Obviously she was anticoagulated again with her chads 2 Vascor equaling 6.  Echocardiogram done in the hospital revealed normal left ventricle ejection fraction there was mild aortic insufficiency mild mitral regurgitation, left atrium was moderately enlarged and the right atrium was mildly enlarged.  Is coming today to my office for follow-up. Overall she is doing very well.  She thinks she is completely back to normal she said very rarely she will have some difficulty finding words and sometimes she gets some burning in her left arm but otherwise doing well.  Denies having any chest pain, tightness, pressure, burning in the chest. We talked in length about options for her atrial fibrillation right now.  Past Medical History:  Diagnosis Date  . Anemia    after hysterectomy at age 29  . Anxiety   . Aortic valve regurgitation, acquired    01/18/14 TEE ([facility]PR): mild AI,  no AS  . Arthritis    osteoarthritis  . Asthma   . Atrial fibrillation and flutter ([place])   . Family history of adverse reaction to anesthesia    dad said during eye surgery he felt everything  . Frequent UTI    hx of - none for a year (as of 01/05/15)  . GERD (gastroesophageal reflux disease)   . H/O seasonal allergies   . Hyperlipidemia   . Hypertension   . Hypothyroidism   . Lipoma    just under right breast  . Mitral valve regurgitation    Trace MR/TR by 01/18/14 TEE ([facility])  . Obesity (BMI 30-39.9)   . Prediabetes   . Thyroid disease    was hyperthyroid, had radiation "Graves Disease"    Past Surgical History:  Procedure Laterality Date  . APPENDECTOMY  1980   during exploratory surgery for endometriosis  . ATRIAL FIBRILLATION ABLATION  01/30/14  . BACK SURGERY  1981   lumbar disc removed  . CARDIOVERSION  06/25/13  . COLONOSCOPY    . KNEE ARTHROSCOPY W/ MENISCAL REPAIR Left 2014  . ROTATOR CUFF REPAIR Left   . throat polyps removed  1979  . TONSILLECTOMY    . TOTAL KNEE ARTHROPLASTY Left 01/18/2015   Procedure: LEFT TOTAL KNEE ARTHROPLASTY;  Surgeon: Mcarthur Rossetti, MD;  Location: [place];  Service: Orthopedics;  Laterality: Left;  Marland Kitchen VAGINAL HYSTERECTOMY  1976    Current Medications: Current Meds  Medication Sig  . amLODipine (NORVASC) 5 MG tablet Take  5 mg by mouth 2 (two) times daily.  Marland Kitchen apixaban (ELIQUIS) 5 MG TABS tablet Take 5 mg by mouth 2 (two) times daily.  Marland Kitchen aspirin 81 MG tablet Take 81 mg by mouth daily.  Marland Kitchen atenolol (TENORMIN) 100 MG tablet Take 100 mg by mouth daily.  Marland Kitchen atorvastatin (LIPITOR) 80 MG tablet Take by mouth.  . escitalopram (LEXAPRO) 10 MG tablet Take by mouth.  . esomeprazole (NEXIUM) 40 MG capsule Take 40 mg by mouth every evening.   . fluticasone (FLONASE) 50 MCG/ACT nasal spray Place into the nose.  Marland Kitchen Fluticasone-Salmeterol (ADVAIR) 250-50 MCG/DOSE AEPB Inhale 1 puff into the lungs 2 (two) times daily as needed (wheezing).  .  furosemide (LASIX) 20 MG tablet Take 20 mg by mouth.  . levothyroxine (SYNTHROID) 75 MCG tablet Take by mouth daily before breakfast.   . lisinopril (ZESTRIL) 10 MG tablet Take 5 mg by mouth daily.   . Multiple Vitamin (MULTIVITAMIN) tablet Take 1 tablet by mouth daily.     Allergies:   Codeine and Latex   Social History   Socioeconomic History  . Marital status: Married    Spouse name: Not on file  . Number of children: Not on file  . Years of education: Not on file  . Highest education level: Not on file  Occupational History  . Not on file  Tobacco Use  . Smoking status: Former Smoker    Quit date: 01/04/1985    Years since quitting: 34.7  . Smokeless tobacco: Never Used  Substance and Sexual Activity  . Alcohol use: Yes    Comment: occasional  . Drug use: No  . Sexual activity: Not on file  Other Topics Concern  . Not on file  Social History Narrative  . Not on file   Social Determinants of Health   Financial Resource Strain:   . Difficulty of Paying Living Expenses: Not on file  Food Insecurity:   . Worried About Charity fundraiser in the Last Year: Not on file  . Ran Out of Food in the Last Year: Not on file  Transportation Needs:   . Lack of Transportation (Medical): Not on file  . Lack of Transportation (Non-Medical): Not on file  Physical Activity:   . Days of Exercise per Week: Not on file  . Minutes of Exercise per Session: Not on file  Stress:   . Feeling of Stress : Not on file  Social Connections:   . Frequency of Communication with Friends and Family: Not on file  . Frequency of Social Gatherings with Friends and Family: Not on file  . Attends Religious Services: Not on file  . Active Member of Clubs or Organizations: Not on file  . Attends Archivist Meetings: Not on file  . Marital Status: Not on file     Family History: The patient's family history includes Autism spectrum disorder in her son; CAD in her mother; COPD in her  daughter; Cancer in her father; Crohn's disease in her daughter; Heart attack in her mother; Heart disease in her father; High Cholesterol in her brother, brother, daughter, and father; Hypertension in her brother, brother, and mother. ROS:   Please see the history of present illness.    All 14 point review of systems negative except as described per history of present illness  EKGs/Labs/Other Studies Reviewed:      Recent Labs: No results found for requested labs within last 8760 hours.  Recent Lipid Panel No results found  for: CHOL, TRIG, HDL, CHOLHDL, VLDL, LDLCALC, LDLDIRECT  Physical Exam:    VS:  Pulse 76   Resp (!) 95   Ht 5\' 2"  (1.575 m)   Wt 186 lb (84.4 kg)   BMI 34.02 kg/m     Wt Readings from Last 3 Encounters:  09/30/19 186 lb (84.4 kg)  01/18/15 178 lb (80.7 kg)  01/05/15 178 lb 4.8 oz (80.9 kg)     GEN:  Well nourished, well developed in no acute distress HEENT: Normal NECK: No JVD; No carotid bruits LYMPHATICS: No lymphadenopathy CARDIAC: RRR, no murmurs, no rubs, no gallops RESPIRATORY:  Clear to auscultation without rales, wheezing or rhonchi  ABDOMEN: Soft, non-tender, non-distended MUSCULOSKELETAL:  No edema; No deformity  SKIN: Warm and dry LOWER EXTREMITIES: no swelling NEUROLOGIC:  Alert and oriented x 3 PSYCHIATRIC:  Normal affect   ASSESSMENT:    1. Persistent atrial fibrillation (HCC)   2. Status post ablation of atrial fibrillation   3. Nonrheumatic mitral valve regurgitation   4. TIA (transient ischemic attack)    PLAN:    In order of problems listed above:  1. Persistent atrial fibrillation.  Will do EKG to confirm the rhythm.  We talked about options for this situation option being tried to cardiovert her after 3 to 4 weeks of successful anticoagulation, initiation of antiarrhythmic therapy or simply leaving her in atrial fibrillation.  She is completely asymptomatic she was never aware of atrial fibrillation.  I would not recommend  another atrial fibrillation ablation.  Simply not worked up since she is completely asymptomatic and doing well even when she is in atrial fibrillation.  Obviously anticoagulation must continue.  And she understands fully. 2. Status post atrial fibrillation ablation done many years ago again would not recommend another atrial fibrillation ablation 3. Nonrheumatic mitral valve regurgitation stable only moderate. 4. History of TIA/CVA.  Noted doing well from that point review.  Again we talked about options for her atrial fibrillation we will do EKG today to confirm the rhythm.  I see her back in about a month and at that time we will decide which way we can proceed.  I will not push harder towards cardioversion.  And I am against atrial fibrillation ablation for the second time in her case since she is completely asymptomatic and doing well while in atrial fibrillation.  I will ask you to wear Zio patch for a week to see if she got any breaks of atrial fibrillation to sinus rhythm if that is the case then we may put her onto antiarrhythmic therapy.  I did review her record from hospital   Medication Adjustments/Labs and Tests Ordered: Current medicines are reviewed at length with the patient today.  Concerns regarding medicines are outlined above.  No orders of the defined types were placed in this encounter.  Medication changes: No orders of the defined types were placed in this encounter.   Signed, Park Liter, MD, [facility] 09/30/2019 8:38 AM    [place]

## 2019-10-02 ENCOUNTER — Ambulatory Visit (INDEPENDENT_AMBULATORY_CARE_PROVIDER_SITE_OTHER): Payer: Medicare Other

## 2019-10-02 DIAGNOSIS — I4819 Other persistent atrial fibrillation: Secondary | ICD-10-CM | POA: Diagnosis not present

## 2019-11-02 ENCOUNTER — Ambulatory Visit (INDEPENDENT_AMBULATORY_CARE_PROVIDER_SITE_OTHER): Payer: Medicare Other | Admitting: Cardiology

## 2019-11-02 ENCOUNTER — Encounter: Payer: Self-pay | Admitting: Cardiology

## 2019-11-02 ENCOUNTER — Other Ambulatory Visit: Payer: Self-pay

## 2019-11-02 VITALS — BP 126/70 | HR 73 | Ht 62.0 in | Wt 187.0 lb

## 2019-11-02 DIAGNOSIS — I6930 Unspecified sequelae of cerebral infarction: Secondary | ICD-10-CM

## 2019-11-02 DIAGNOSIS — I340 Nonrheumatic mitral (valve) insufficiency: Secondary | ICD-10-CM

## 2019-11-02 DIAGNOSIS — I10 Essential (primary) hypertension: Secondary | ICD-10-CM | POA: Diagnosis not present

## 2019-11-02 DIAGNOSIS — I4819 Other persistent atrial fibrillation: Secondary | ICD-10-CM

## 2019-11-02 DIAGNOSIS — Z8679 Personal history of other diseases of the circulatory system: Secondary | ICD-10-CM

## 2019-11-02 DIAGNOSIS — Z9889 Other specified postprocedural states: Secondary | ICD-10-CM | POA: Diagnosis not present

## 2019-11-02 HISTORY — DX: Unspecified sequelae of cerebral infarction: I69.30

## 2019-11-02 NOTE — Progress Notes (Signed)
Cardiology Office Note:    Date:  11/02/2019   ID:  Kim Rice, DOB June 18, 1946, MRN [id number]  PCP:  Raina Mina., MD  Cardiologist:  Jenne Campus, MD    Referring MD: Raina Mina., MD   No chief complaint on file.   History of Present Illness:    Kim Rice is a 74 y.o. female with past medical history significant for paroxysmal atrial fibrillation, status post atrial fibrillation ablation done almost 10 years ago, after that her anticoagulation has been withdrawn.  And then she presented about 2 months ago to [facility] with episode of TIA/CVA.  She was found to be back in atrial fibrillation.  Also at the time of that evaluation she was found to have moderate mitral regurgitation.  She was promptly put on anticoagulation her atrial fibrillation has been controlled and she comes today to my office to talk about this issue.  We talking about cardioversion.  Her biatrial enlargement was noted on the echocardiogram.  She is very reluctant to have a cardioversion.  She said she never feel when she got the atrial fibrillation she simply does not feel better and she also understands she will have to be taking anticoagulation until the end of her life.  Therefore she questioning why want to do that.  Also with all the situation with coronavirus whether she said she does not want to do anything right now.  Past Medical History:  Diagnosis Date  . Anemia    after hysterectomy at age 3  . Anxiety   . Aortic valve regurgitation, acquired    01/18/14 TEE ([facility]): mild AI, no AS  . Arthritis    osteoarthritis  . Asthma   . Atrial fibrillation and flutter ([employer])   . Family history of adverse reaction to anesthesia    dad said during eye surgery he felt everything  . Frequent UTI    hx of - none for a year (as of 01/05/15)  . GERD (gastroesophageal reflux disease)   . H/O seasonal allergies   . Hyperlipidemia   . Hypertension   . Hypothyroidism   . Lipoma    just  under right breast  . Mitral valve regurgitation    Trace MR/TR by 01/18/14 TEE ([facility])  . Obesity (BMI 30-39.9)   . Prediabetes   . Thyroid disease    was hyperthyroid, had radiation "Graves Disease"    Past Surgical History:  Procedure Laterality Date  . APPENDECTOMY  1980   during exploratory surgery for endometriosis  . ATRIAL FIBRILLATION ABLATION  01/30/14  . BACK SURGERY  1981   lumbar disc removed  . CARDIOVERSION  06/25/13  . COLONOSCOPY    . KNEE ARTHROSCOPY W/ MENISCAL REPAIR Left 2014  . ROTATOR CUFF REPAIR Left   . throat polyps removed  1979  . TONSILLECTOMY    . TOTAL KNEE ARTHROPLASTY Left 01/18/2015   Procedure: LEFT TOTAL KNEE ARTHROPLASTY;  Surgeon: Mcarthur Rossetti, MD;  Location: [place];  Service: Orthopedics;  Laterality: Left;  Marland Kitchen VAGINAL HYSTERECTOMY  1976    Current Medications: Current Meds  Medication Sig  . amLODipine (NORVASC) 5 MG tablet Take 5 mg by mouth 2 (two) times daily.  Marland Kitchen apixaban (ELIQUIS) 5 MG TABS tablet Take 5 mg by mouth 2 (two) times daily.  Marland Kitchen aspirin 81 MG tablet Take 81 mg by mouth daily.  Marland Kitchen atenolol (TENORMIN) 100 MG tablet Take 100 mg by mouth daily.  Marland Kitchen atorvastatin (LIPITOR) 80 MG tablet  Take by mouth.  . escitalopram (LEXAPRO) 10 MG tablet Take by mouth.  . esomeprazole (NEXIUM) 40 MG capsule Take 40 mg by mouth every evening.   . fluticasone (FLONASE) 50 MCG/ACT nasal spray Place into the nose.  Marland Kitchen Fluticasone-Salmeterol (ADVAIR) 250-50 MCG/DOSE AEPB Inhale 1 puff into the lungs 2 (two) times daily as needed (wheezing).  . furosemide (LASIX) 20 MG tablet Take 20 mg by mouth.  . levothyroxine (SYNTHROID) 75 MCG tablet Take by mouth daily before breakfast.   . lisinopril (ZESTRIL) 10 MG tablet Take 5 mg by mouth daily.   . Multiple Vitamin (MULTIVITAMIN) tablet Take 1 tablet by mouth daily.  . rivaroxaban (XARELTO) 10 MG TABS tablet Take 1 tablet (10 mg total) by mouth daily with breakfast.     Allergies:   Codeine and Latex    Social History   Socioeconomic History  . Marital status: Married    Spouse name: Not on file  . Number of children: Not on file  . Years of education: Not on file  . Highest education level: Not on file  Occupational History  . Not on file  Tobacco Use  . Smoking status: Former Smoker    Quit date: 01/04/1985    Years since quitting: 34.8  . Smokeless tobacco: Never Used  Substance and Sexual Activity  . Alcohol use: Yes    Comment: occasional  . Drug use: No  . Sexual activity: Not on file  Other Topics Concern  . Not on file  Social History Narrative  . Not on file   Social Determinants of Health   Financial Resource Strain:   . Difficulty of Paying Living Expenses: Not on file  Food Insecurity:   . Worried About Charity fundraiser in the Last Year: Not on file  . Ran Out of Food in the Last Year: Not on file  Transportation Needs:   . Lack of Transportation (Medical): Not on file  . Lack of Transportation (Non-Medical): Not on file  Physical Activity:   . Days of Exercise per Week: Not on file  . Minutes of Exercise per Session: Not on file  Stress:   . Feeling of Stress : Not on file  Social Connections:   . Frequency of Communication with Friends and Family: Not on file  . Frequency of Social Gatherings with Friends and Family: Not on file  . Attends Religious Services: Not on file  . Active Member of Clubs or Organizations: Not on file  . Attends Archivist Meetings: Not on file  . Marital Status: Not on file     Family History: The patient's family history includes Autism spectrum disorder in her son; CAD in her mother; COPD in her daughter; Cancer in her father; Crohn's disease in her daughter; Heart attack in her mother; Heart disease in her father; High Cholesterol in her brother, brother, daughter, and father; Hypertension in her brother, brother, and mother. ROS:   Please see the history of present illness.    All 14 point review of  systems negative except as described per history of present illness  EKGs/Labs/Other Studies Reviewed:      Recent Labs: No results found for requested labs within last 8760 hours.  Recent Lipid Panel No results found for: CHOL, TRIG, HDL, CHOLHDL, VLDL, LDLCALC, LDLDIRECT  Physical Exam:    VS:  BP 126/70   Pulse 73   Ht 5\' 2"  (1.575 m)   Wt 187 lb (84.8 kg)  SpO2 96%   BMI 34.20 kg/m     Wt Readings from Last 3 Encounters:  11/02/19 187 lb (84.8 kg)  09/30/19 186 lb (84.4 kg)  01/18/15 178 lb (80.7 kg)     GEN:  Well nourished, well developed in no acute distress HEENT: Normal NECK: No JVD; No carotid bruits LYMPHATICS: No lymphadenopathy CARDIAC: R irregular irregular, no murmurs, no rubs, no gallops RESPIRATORY:  Clear to auscultation without rales, wheezing or rhonchi  ABDOMEN: Soft, non-tender, non-distended MUSCULOSKELETAL:  No edema; No deformity  SKIN: Warm and dry LOWER EXTREMITIES: no swelling NEUROLOGIC:  Alert and oriented x 3 PSYCHIATRIC:  Normal affect   ASSESSMENT:    1. Persistent atrial fibrillation ([place])   2. Nonrheumatic mitral valve regurgitation   3. Essential hypertension   4. Status post ablation of atrial fibrillation   5. Late effect of cerebrovascular accident (CVA)    PLAN:    In order of problems listed above:  1. Persistent atrial fibrillation.  Ask you to think about potential cardioversion we meet again in about 2 months at that time we will decide if we want to do anything.  I get a feeling that she will not want to cardiovert.  If that will be the case then we dealing with permanent atrial fibrillation.  In the meantime continue anticoagulation as well as AV blockade.  I did put monitor on her which showed controlled ventricular rate of atrial fibrillation mitral valve regurgitation.  Is hemodynamically stable doing well denies having a problem 2. History of atrial fibrillation ablation, obviously failed since she is back in  atrial fibrillation 3. Late effect of CVA only minimal changes overall doing well from that point review.   Medication Adjustments/Labs and Tests Ordered: Current medicines are reviewed at length with the patient today.  Concerns regarding medicines are outlined above.  No orders of the defined types were placed in this encounter.  Medication changes: No orders of the defined types were placed in this encounter.   Signed, Park Liter, MD, [facility] 11/02/2019 10:19 AM    [place]

## 2019-11-02 NOTE — Patient Instructions (Signed)
Medication Instructions:  Your physician recommends that you continue on your current medications as directed. Please refer to the Current Medication list given to you today.  *If you need a refill on your cardiac medications before your next appointment, please call your pharmacy*  Lab Work: None.  If you have labs (blood work) drawn today and your tests are completely normal, you will receive your results only by: . MyChart Message (if you have MyChart) OR . A paper copy in the mail If you have any lab test that is abnormal or we need to change your treatment, we will call you to review the results.  Testing/Procedures: None.   Follow-Up: At [facility], you and your health needs are our priority.  As part of our continuing mission to provide you with exceptional heart care, we have created designated Provider Care Teams.  These Care Teams include your primary Cardiologist (physician) and Advanced Practice Providers (APPs -  Physician Assistants and Nurse Practitioners) who all work together to provide you with the care you need, when you need it.  Your next appointment:   2 month(s)  The format for your next appointment:   In Person  Provider:   Robert Krasowski, MD  Other Instructions  

## 2019-12-31 ENCOUNTER — Other Ambulatory Visit: Payer: Self-pay

## 2019-12-31 ENCOUNTER — Encounter: Payer: Self-pay | Admitting: Cardiology

## 2019-12-31 ENCOUNTER — Encounter (INDEPENDENT_AMBULATORY_CARE_PROVIDER_SITE_OTHER): Payer: Self-pay

## 2019-12-31 ENCOUNTER — Ambulatory Visit (INDEPENDENT_AMBULATORY_CARE_PROVIDER_SITE_OTHER): Payer: Medicare Other | Admitting: Cardiology

## 2019-12-31 VITALS — BP 122/70 | HR 92 | Temp 97.1°F | Ht 62.0 in | Wt 186.6 lb

## 2019-12-31 DIAGNOSIS — R0602 Shortness of breath: Secondary | ICD-10-CM

## 2019-12-31 DIAGNOSIS — I10 Essential (primary) hypertension: Secondary | ICD-10-CM | POA: Diagnosis not present

## 2019-12-31 DIAGNOSIS — I340 Nonrheumatic mitral (valve) insufficiency: Secondary | ICD-10-CM | POA: Diagnosis not present

## 2019-12-31 DIAGNOSIS — I4819 Other persistent atrial fibrillation: Secondary | ICD-10-CM | POA: Diagnosis not present

## 2019-12-31 DIAGNOSIS — Z79899 Other long term (current) drug therapy: Secondary | ICD-10-CM

## 2019-12-31 DIAGNOSIS — G459 Transient cerebral ischemic attack, unspecified: Secondary | ICD-10-CM | POA: Diagnosis not present

## 2019-12-31 NOTE — Addendum Note (Signed)
Addended by: Mendel Ryder on: 12/31/2019 01:58 PM   Modules accepted: Orders

## 2019-12-31 NOTE — Patient Instructions (Signed)
Medication Instructions:  Your physician recommends that you continue on your current medications as directed. Please refer to the Current Medication list given to you today.  *If you need a refill on your cardiac medications before your next appointment, please call your pharmacy*   Lab Work: [place] - Today   If you have labs (blood work) drawn today and your tests are completely normal, you will receive your results only by: Marland Kitchen MyChart Message (if you have MyChart) OR . A paper copy in the mail If you have any lab test that is abnormal or we need to change your treatment, we will call you to review the results.   Testing/Procedures: None ordered    Follow-Up: At [facility], you and your health needs are our priority.  As part of our continuing mission to provide you with exceptional heart care, we have created designated Provider Care Teams.  These Care Teams include your primary Cardiologist (physician) and Advanced Practice Providers (APPs -  Physician Assistants and Nurse Practitioners) who all work together to provide you with the care you need, when you need it.  We recommend signing up for the patient portal called "MyChart".  Sign up information is provided on this After Visit Summary.  MyChart is used to connect with patients for Virtual Visits (Telemedicine).  Patients are able to view lab/test results, encounter notes, upcoming appointments, etc.  Non-urgent messages can be sent to your provider as well.   To learn more about what you can do with MyChart, go to [email or web address].    Your next appointment:   3-4 week(s)  The format for your next appointment:   In Person  Provider:   Jenne Campus, MD   Other Instructions None

## 2019-12-31 NOTE — Addendum Note (Signed)
Addended by: Mendel Ryder on: 12/31/2019 01:37 PM   Modules accepted: Orders

## 2019-12-31 NOTE — Progress Notes (Signed)
Cardiology Office Note:    Date:  12/31/2019   ID:  [name], DOB 1946-05-24, MRN [id number]  PCP:  Raina Mina., MD  Cardiologist:  Jenne Campus, MD    Referring MD: Raina Mina., MD   No chief complaint on file. I do not feel well  History of Present Illness:    Kim Rice is a 74 y.o. female  with past medical history significant for paroxysmal atrial fibrillation, status post atrial fibrillation ablation done almost 10 years ago, after that her anticoagulation has been withdrawn.  And then she presented about 2 months ago to [facility] with episode of TIA/CVA.  She was found to be back in atrial fibrillation.  Also at the time of that evaluation she was found to have moderate mitral regurgitation.  She was promptly put on anticoagulation her atrial fibrillation has been controlled and she comes today to my office to talk about this issue.  We talking about cardioversion.  Her biatrial enlargement was noted on the echocardiogram Today she is very frustrated and disappointed.  Last time I spoke to her she was reluctant to consider cardioversion now she is desperate she said something to be done.  She complained of being weak tired and exhausted.  She needs to go and do a lot of things that she cannot do it.    Past Medical History:  Diagnosis Date  . Abnormal myocardial perfusion study 10/18/2017  . Anemia    after hysterectomy at age 84  . Anxiety   . Aortic atherosclerosis ([place]) 09/15/2019  . Aortic valve regurgitation, acquired    01/18/14 TEE (HPR): mild AI, no AS  . Arthritis    osteoarthritis  . Asthma   . Atrial fibrillation and flutter ([place])   . Chronic right-sided low back pain without sciatica 02/29/2016  . Chronic right-sided thoracic back pain 02/29/2016  . Class 1 obesity due to excess calories with serious comorbidity and body mass index (BMI) of 33.0 to 33.9 in adult 01/17/2016  . Degenerative cervical disc 06/06/2018  . Essential hypertension  01/17/2016  . Family history of adverse reaction to anesthesia    dad said during eye surgery he felt everything  . Frequent UTI    hx of - none for a year (as of 01/05/15)  . GERD (gastroesophageal reflux disease)   . H/O seasonal allergies   . High risk medication use 01/17/2016  . History of blood transfusion 09/14/2016  . History of Graves' disease 01/17/2016  . Hyperlipidemia   . Hypertension   . Hypothyroidism   . Late effect of cerebrovascular accident (CVA) 11/02/2019  . Lipoma    just under right breast  . Major depressive disorder 01/17/2016  . Mild aortic stenosis 10/18/2017  . Mitral regurgitation moderate by echocardiogram from 2021 01/17/2016  . Mitral valve regurgitation    Trace MR/TR by 01/18/14 TEE (HPR)  . Mixed hyperlipidemia 01/17/2016  . OA (osteoarthritis) 01/17/2016  . Obesity (BMI 30-39.9)   . Osteoarthritis of left knee 01/18/2015  . PAF (paroxysmal atrial fibrillation) ([place]) 01/17/2016   Formatting of this note might be different from the original. Ablation 2015.  TIA 08/2019.  Now on eliquis.  . Persistent atrial fibrillation ([place]) 09/30/2019  . Postmenopausal 01/17/2016  . Prediabetes   . Primary hyperparathyroidism ([place]) 01/17/2016   Formatting of this note might be different from the original. Negative w/u in 2015 Abnormal 2021.  . S/P cryoablation of arrhythmia 10/18/2017  . Status post ablation of atrial  fibrillation 09/30/2019  . 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 Thyroid disease    was hyperthyroid, had radiation "Graves Disease"  . TIA (transient ischemic attack) 09/15/2019  . Tinea corporis 01/17/2016    Past Surgical History:  Procedure Laterality Date  . APPENDECTOMY  1980   during exploratory surgery for endometriosis  . ATRIAL FIBRILLATION ABLATION  01/30/14  . BACK SURGERY  1981   lumbar disc removed  . CARDIOVERSION  06/25/13  . COLONOSCOPY    . KNEE ARTHROSCOPY W/ MENISCAL REPAIR Left 2014  . ROTATOR CUFF REPAIR Left   . throat polyps  removed  1979  . TONSILLECTOMY    . TOTAL KNEE ARTHROPLASTY Left 01/18/2015   Procedure: LEFT TOTAL KNEE ARTHROPLASTY;  Surgeon: Mcarthur Rossetti, MD;  Location: [facility];  Service: Orthopedics;  Laterality: Left;  Marland Kitchen VAGINAL HYSTERECTOMY  1976    Current Medications: Current Meds  Medication Sig  . amLODipine (NORVASC) 5 MG tablet Take 5 mg by mouth 2 (two) times daily.  Marland Kitchen apixaban (ELIQUIS) 5 MG TABS tablet Take 5 mg by mouth 2 (two) times daily.  Marland Kitchen aspirin 81 MG tablet Take 81 mg by mouth daily.  Marland Kitchen atenolol (TENORMIN) 100 MG tablet Take 100 mg by mouth daily.  Marland Kitchen atorvastatin (LIPITOR) 80 MG tablet Take by mouth.  . escitalopram (LEXAPRO) 10 MG tablet Take by mouth.  . esomeprazole (NEXIUM) 40 MG capsule Take 40 mg by mouth every evening.   . fluticasone (FLONASE) 50 MCG/ACT nasal spray Place into the nose.  Marland Kitchen Fluticasone-Salmeterol (ADVAIR) 250-50 MCG/DOSE AEPB Inhale 1 puff into the lungs 2 (two) times daily as needed (wheezing).  . furosemide (LASIX) 20 MG tablet Take 20 mg by mouth.  . levothyroxine (SYNTHROID) 75 MCG tablet Take by mouth daily before breakfast.   . lisinopril (ZESTRIL) 10 MG tablet Take 5 mg by mouth daily.      Allergies:   Codeine and Latex   Social History   Socioeconomic History  . Marital status: Married    Spouse name: Not on file  . Number of children: Not on file  . Years of education: Not on file  . Highest education level: Not on file  Occupational History  . Not on file  Tobacco Use  . Smoking status: Former Smoker    Quit date: 01/04/1985    Years since quitting: 35.0  . Smokeless tobacco: Never Used  Substance and Sexual Activity  . Alcohol use: Yes    Comment: occasional  . Drug use: No  . Sexual activity: Not on file  Other Topics Concern  . Not on file  Social History Narrative  . Not on file   Social Determinants of Health   Financial Resource Strain:   . Difficulty of Paying Living Expenses:   Food Insecurity:   .  Worried About Charity fundraiser in the Last Year:   . Arboriculturist in the Last Year:   Transportation Needs:   . Film/video editor (Medical):   Marland Kitchen Lack of Transportation (Non-Medical):   Physical Activity:   . Days of Exercise per Week:   . Minutes of Exercise per Session:   Stress:   . Feeling of Stress :   Social Connections:   . Frequency of Communication with Friends and Family:   . Frequency of Social Gatherings with Friends and Family:   . Attends Religious Services:   . Active Member of Clubs or Organizations:   .  Attends Archivist Meetings:   Marland Kitchen Marital Status:      Family History: The patient's family history includes Autism spectrum disorder in her son; CAD in her mother; COPD in her daughter; Cancer in her father; Crohn's disease in her daughter; Heart attack in her mother; Heart disease in her father; High Cholesterol in her brother, brother, daughter, and father; Hypertension in her brother, brother, and mother. ROS:   Please see the history of present illness.    All 14 point review of systems negative except as described per history of present illness  EKGs/Labs/Other Studies Reviewed:      Recent Labs: No results found for requested labs within last 8760 hours.  Recent Lipid Panel No results found for: CHOL, TRIG, HDL, CHOLHDL, VLDL, LDLCALC, LDLDIRECT  Physical Exam:    VS:  BP 122/70   Pulse 92   Temp (!) 97.1 F (36.2 C)   Ht 5\' 2"  (1.575 m)   Wt 186 lb 9.6 oz (84.6 kg)   SpO2 95%   BMI 34.13 kg/m     Wt Readings from Last 3 Encounters:  12/31/19 186 lb 9.6 oz (84.6 kg)  11/02/19 187 lb (84.8 kg)  09/30/19 186 lb (84.4 kg)     GEN:  Well nourished, well developed in no acute distress HEENT: Normal NECK: No JVD; No carotid bruits LYMPHATICS: No lymphadenopathy CARDIAC: Irregular irregular, holosystolic murmur grade 2/6 best heard at the apex, no rubs, no gallops RESPIRATORY:  Clear to auscultation without rales, wheezing or  rhonchi  ABDOMEN: Soft, non-tender, non-distended MUSCULOSKELETAL:  No edema; No deformity  SKIN: Warm and dry LOWER EXTREMITIES: no swelling NEUROLOGIC:  Alert and oriented x 3 PSYCHIATRIC:  Normal affect   ASSESSMENT:    1. Nonrheumatic mitral valve regurgitation   2. Persistent atrial fibrillation ([place])   3. TIA (transient ischemic attack)   4. Essential hypertension    PLAN:    In order of problems listed above:  1. Nonrheumatic mitral valve regurgitation moderate by last echocardiogram done in the hospital.  Overall she appears to be hemodynamically stable, however, I will ask him to have proBNP done.  If proBNP is elevated "we will manage this appropriately she may require another echocardiogram she may require some diuretic.  If not I am afraid the only way to help her will be to attempt cardioversion. 2. Persistent atrial fibrillation, she is anticoagulated which I will continue.  Plan as outlined above 3. Essential hypertension blood pressure well controlled continue present management. 4. History of TIA, stable.   Medication Adjustments/Labs and Tests Ordered: Current medicines are reviewed at length with the patient today.  Concerns regarding medicines are outlined above.  No orders of the defined types were placed in this encounter.  Medication changes: No orders of the defined types were placed in this encounter.   Signed, Park Liter, MD, [facility] 12/31/2019 1:29 PM    [place]

## 2019-12-31 NOTE — Addendum Note (Signed)
Addended by: Mendel Ryder on: 12/31/2019 01:56 PM   Modules accepted: Orders

## 2020-01-01 LAB — PRO B NATRIURETIC PEPTIDE: NT-Pro BNP: 1344 pg/mL — ABNORMAL HIGH (ref 0–301)

## 2020-01-07 ENCOUNTER — Telehealth: Payer: Self-pay

## 2020-01-07 DIAGNOSIS — I340 Nonrheumatic mitral (valve) insufficiency: Secondary | ICD-10-CM

## 2020-01-07 DIAGNOSIS — I4819 Other persistent atrial fibrillation: Secondary | ICD-10-CM

## 2020-01-07 MED ORDER — FUROSEMIDE 20 MG PO TABS: 40.0000 mg | ORAL_TABLET | Freq: Every day | ORAL | 3 refills | Status: DC

## 2020-01-07 NOTE — Telephone Encounter (Signed)
-----   Message from Park Liter, MD sent at 01/06/2020  1:04 PM EDT ----- She does have evidence of congestive heart failure.  Please double the dose of furosemide to 40 mg daily, please check Chem-7 for the beginning of next week

## 2020-01-07 NOTE — Telephone Encounter (Signed)
Spoke with patient regarding results and recommendation.  Patient verbalizes understanding and is agreeable to plan of care. Advised patient to call back with any issues or concerns.   Orders placed.

## 2020-01-11 LAB — BASIC METABOLIC PANEL
BUN/Creatinine Ratio: 18 (ref 12–28)
BUN: 18 mg/dL (ref 8–27)
CO2: 25 mmol/L (ref 20–29)
Calcium: 10.4 mg/dL — ABNORMAL HIGH (ref 8.7–10.3)
Chloride: 106 mmol/L (ref 96–106)
Creatinine, Ser: 1.01 mg/dL — ABNORMAL HIGH (ref 0.57–1.00)
GFR calc Af Amer: 64 mL/min/{1.73_m2} (ref >59)
GFR calc non Af Amer: 55 mL/min/{1.73_m2} — ABNORMAL LOW (ref >59)
Glucose: 97 mg/dL (ref 65–99)
Potassium: 5 mmol/L (ref 3.5–5.2)
Sodium: 145 mmol/L — ABNORMAL HIGH (ref 134–144)

## 2020-01-19 ENCOUNTER — Encounter: Payer: Self-pay | Admitting: Cardiology

## 2020-01-19 ENCOUNTER — Other Ambulatory Visit: Payer: Self-pay

## 2020-01-19 ENCOUNTER — Ambulatory Visit (INDEPENDENT_AMBULATORY_CARE_PROVIDER_SITE_OTHER): Payer: Medicare Other | Admitting: Cardiology

## 2020-01-19 VITALS — BP 132/64 | HR 73 | Ht 62.0 in | Wt 185.0 lb

## 2020-01-19 DIAGNOSIS — Z9889 Other specified postprocedural states: Secondary | ICD-10-CM | POA: Diagnosis not present

## 2020-01-19 DIAGNOSIS — I10 Essential (primary) hypertension: Secondary | ICD-10-CM

## 2020-01-19 DIAGNOSIS — I340 Nonrheumatic mitral (valve) insufficiency: Secondary | ICD-10-CM

## 2020-01-19 DIAGNOSIS — I4819 Other persistent atrial fibrillation: Secondary | ICD-10-CM

## 2020-01-19 DIAGNOSIS — Z8679 Personal history of other diseases of the circulatory system: Secondary | ICD-10-CM

## 2020-01-19 MED ORDER — FUROSEMIDE 40 MG PO TABS: 80.0000 mg | ORAL_TABLET | Freq: Every day | ORAL | 1 refills | Status: DC

## 2020-01-19 NOTE — Progress Notes (Signed)
Cardiology Office Note:    Date:  01/19/2020   ID:  [name], DOB 1946-03-15, MRN [id number]  PCP:  Raina Mina., MD  Cardiologist:  Jenne Campus, MD    Referring MD: Raina Mina., MD   No chief complaint on file. Feeling slightly better  History of Present Illness:    Kim Rice is a 74 y.o. female   with past medical history significant for paroxysmal atrial fibrillation, status post atrial fibrillation ablation done almost 10 years ago, after that her anticoagulation has been withdrawn. And then she presented about 2 months ago to [facility] with episode of TIA/CVA. She was found to be back in atrial fibrillation. Also at the time of that evaluation she was found to have moderate mitral regurgitation. She was promptly put on anticoagulation her atrial fibrillation has been controlled and she comes today to my office to talk about this issue. Last time I have seen her I did proBNP which is significantly elevated.  Because of her shortness of breath I gave her some diuretic.  She said she feels maybe 10% better but still not up to the poor.  She said doing anything will bring shortness of breath.  We talked about options for this situation we elected to intensify her diuresis.  In terms of her electrical cardioversion will decide about this next time when I see her.  Past Medical History:  Diagnosis Date  . Abnormal myocardial perfusion study 10/18/2017  . Anemia    after hysterectomy at age 76  . Anxiety   . Aortic atherosclerosis ([place]) 09/15/2019  . Aortic valve regurgitation, acquired    01/18/14 TEE (HPR): mild AI, no AS  . Arthritis    osteoarthritis  . Asthma   . Atrial fibrillation and flutter ([place])   . Chronic right-sided low back pain without sciatica 02/29/2016  . Chronic right-sided thoracic back pain 02/29/2016  . Class 1 obesity due to excess calories with serious comorbidity and body mass index (BMI) of 33.0 to 33.9 in adult 01/17/2016  .  Degenerative cervical disc 06/06/2018  . Essential hypertension 01/17/2016  . Family history of adverse reaction to anesthesia    dad said during eye surgery he felt everything  . Frequent UTI    hx of - none for a year (as of 01/05/15)  . GERD (gastroesophageal reflux disease)   . H/O seasonal allergies   . High risk medication use 01/17/2016  . History of blood transfusion 09/14/2016  . History of Graves' disease 01/17/2016  . Hyperlipidemia   . Hypertension   . Hypothyroidism   . Late effect of cerebrovascular accident (CVA) 11/02/2019  . Lipoma    just under right breast  . Major depressive disorder 01/17/2016  . Mild aortic stenosis 10/18/2017  . Mitral regurgitation moderate by echocardiogram from 2021 01/17/2016  . Mitral valve regurgitation    Trace MR/TR by 01/18/14 TEE (HPR)  . Mixed hyperlipidemia 01/17/2016  . OA (osteoarthritis) 01/17/2016  . Obesity (BMI 30-39.9)   . Osteoarthritis of left knee 01/18/2015  . PAF (paroxysmal atrial fibrillation) ([place]) 01/17/2016   Formatting of this note might be different from the original. Ablation 2015.  TIA 08/2019.  Now on eliquis.  . Persistent atrial fibrillation ([place]) 09/30/2019  . Postmenopausal 01/17/2016  . Prediabetes   . Primary hyperparathyroidism ([place]) 01/17/2016   Formatting of this note might be different from the original. Negative w/u in 2015 Abnormal 2021.  . S/P cryoablation of arrhythmia 10/18/2017  .  Status post ablation of atrial fibrillation 09/30/2019  . 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 Thyroid disease    was hyperthyroid, had radiation "Graves Disease"  . TIA (transient ischemic attack) 09/15/2019  . Tinea corporis 01/17/2016    Past Surgical History:  Procedure Laterality Date  . APPENDECTOMY  1980   during exploratory surgery for endometriosis  . ATRIAL FIBRILLATION ABLATION  01/30/14  . BACK SURGERY  1981   lumbar disc removed  . CARDIOVERSION  06/25/13  . COLONOSCOPY    . KNEE ARTHROSCOPY W/ MENISCAL REPAIR  Left 2014  . ROTATOR CUFF REPAIR Left   . throat polyps removed  1979  . TONSILLECTOMY    . TOTAL KNEE ARTHROPLASTY Left 01/18/2015   Procedure: LEFT TOTAL KNEE ARTHROPLASTY;  Surgeon: Mcarthur Rossetti, MD;  Location: [facility];  Service: Orthopedics;  Laterality: Left;  Marland Kitchen VAGINAL HYSTERECTOMY  1976    Current Medications: Current Meds  Medication Sig  . amLODipine (NORVASC) 5 MG tablet Take 5 mg by mouth 2 (two) times daily.  Marland Kitchen apixaban (ELIQUIS) 5 MG TABS tablet Take 5 mg by mouth 2 (two) times daily.  Marland Kitchen aspirin 81 MG tablet Take 81 mg by mouth daily.  Marland Kitchen atenolol (TENORMIN) 100 MG tablet Take 100 mg by mouth daily.  Marland Kitchen atorvastatin (LIPITOR) 80 MG tablet Take by mouth.  . escitalopram (LEXAPRO) 10 MG tablet Take by mouth.  . esomeprazole (NEXIUM) 40 MG capsule Take 40 mg by mouth every evening.   . fluticasone (FLONASE) 50 MCG/ACT nasal spray Place into the nose.  Marland Kitchen Fluticasone-Salmeterol (ADVAIR) 250-50 MCG/DOSE AEPB Inhale 1 puff into the lungs 2 (two) times daily as needed (wheezing).  . furosemide (LASIX) 40 MG tablet Take 2 tablets (80 mg total) by mouth daily.  Marland Kitchen levothyroxine (SYNTHROID) 75 MCG tablet Take by mouth daily before breakfast.   . lisinopril (ZESTRIL) 10 MG tablet Take 5 mg by mouth daily.   . [DISCONTINUED] furosemide (LASIX) 20 MG tablet Take 2 tablets (40 mg total) by mouth daily.     Allergies:   Codeine and Latex   Social History   Socioeconomic History  . Marital status: Married    Spouse name: Not on file  . Number of children: Not on file  . Years of education: Not on file  . Highest education level: Not on file  Occupational History  . Not on file  Tobacco Use  . Smoking status: Former Smoker    Quit date: 01/04/1985    Years since quitting: 35.0  . Smokeless tobacco: Never Used  Substance and Sexual Activity  . Alcohol use: Yes    Comment: occasional  . Drug use: No  . Sexual activity: Not on file  Other Topics Concern  . Not on file    Social History Narrative  . Not on file   Social Determinants of Health   Financial Resource Strain:   . Difficulty of Paying Living Expenses:   Food Insecurity:   . Worried About Charity fundraiser in the Last Year:   . Arboriculturist in the Last Year:   Transportation Needs:   . Film/video editor (Medical):   Marland Kitchen Lack of Transportation (Non-Medical):   Physical Activity:   . Days of Exercise per Week:   . Minutes of Exercise per Session:   Stress:   . Feeling of Stress :   Social Connections:   . Frequency of Communication with Friends and Family:   .  Frequency of Social Gatherings with Friends and Family:   . Attends Religious Services:   . Active Member of Clubs or Organizations:   . Attends Archivist Meetings:   Marland Kitchen Marital Status:      Family History: The patient's family history includes Autism spectrum disorder in her son; CAD in her mother; COPD in her daughter; Cancer in her father; Crohn's disease in her daughter; Heart attack in her mother; Heart disease in her father; High Cholesterol in her brother, brother, daughter, and father; Hypertension in her brother, brother, and mother. ROS:   Please see the history of present illness.    All 14 point review of systems negative except as described per history of present illness  EKGs/Labs/Other Studies Reviewed:      Recent Labs: 12/31/2019: NT-Pro BNP 1,344 01/11/2020: BUN 18; Creatinine, Ser 1.01; Potassium 5.0; Sodium 145  Recent Lipid Panel No results found for: CHOL, TRIG, HDL, CHOLHDL, VLDL, LDLCALC, LDLDIRECT  Physical Exam:    VS:  BP 132/64   Pulse 73   Ht 5\' 2"  (1.575 m)   Wt 185 lb (83.9 kg)   SpO2 98%   BMI 33.84 kg/m     Wt Readings from Last 3 Encounters:  01/19/20 185 lb (83.9 kg)  12/31/19 186 lb 9.6 oz (84.6 kg)  11/02/19 187 lb (84.8 kg)     GEN:  Well nourished, well developed in no acute distress HEENT: Normal NECK: No JVD; No carotid bruits LYMPHATICS: No  lymphadenopathy CARDIAC: Irregular irregular, no murmurs, no rubs, no gallops RESPIRATORY:  Clear to auscultation without rales, wheezing or rhonchi  ABDOMEN: Soft, non-tender, non-distended MUSCULOSKELETAL:  No edema; No deformity  SKIN: Warm and dry LOWER EXTREMITIES: no swelling NEUROLOGIC:  Alert and oriented x 3 PSYCHIATRIC:  Normal affect   ASSESSMENT:    1. Persistent atrial fibrillation ([place])   2. Nonrheumatic mitral valve regurgitation   3. Essential hypertension   4. S/P cryoablation of arrhythmia    PLAN:    In order of problems listed above:  1. Persistent atrial fibrillation.  She is anticoagulated which I will continue. 2. Nonrheumatic mitral valve regurgitation.  Will intensify her medical therapy.  I will increase her furosemide from 40 to 80 mg.  Last time her potassium was 5, therefore I will not give her potassium, however, she will get Chem-7 done within next few days. 3. Essential hypertension blood pressure appears to well controlled continue present management. 4. Status post ablation of atrial fibrillation obviously now she is in persistent atrial fibrillation conversation about cardioversion will continue.   Medication Adjustments/Labs and Tests Ordered: Current medicines are reviewed at length with the patient today.  Concerns regarding medicines are outlined above.  Orders Placed This Encounter  Procedures  . Basic metabolic panel   Medication changes:  Meds ordered this encounter  Medications  . furosemide (LASIX) 40 MG tablet    Sig: Take 2 tablets (80 mg total) by mouth daily.    Dispense:  60 tablet    Refill:  1    Signed, Park Liter, MD, [facility] 01/19/2020 11:38 AM    [place]

## 2020-01-19 NOTE — Patient Instructions (Signed)
Medication Instructions:  Your physician has recommended you make the following change in your medication:  INCREASE: Lasix to 80 mg daily  *If you need a refill on your cardiac medications before your next appointment, please call your pharmacy*   Lab Work: Your physician recommends that you return for lab work on Thursday: bmp   If you have labs (blood work) drawn today and your tests are completely normal, you will receive your results only by: Marland Kitchen MyChart Message (if you have MyChart) OR . A paper copy in the mail If you have any lab test that is abnormal or we need to change your treatment, we will call you to review the results.   Testing/Procedures: None.    Follow-Up: At [facility], you and your health needs are our priority.  As part of our continuing mission to provide you with exceptional heart care, we have created designated Provider Care Teams.  These Care Teams include your primary Cardiologist (physician) and Advanced Practice Providers (APPs -  Physician Assistants and Nurse Practitioners) who all work together to provide you with the care you need, when you need it.  We recommend signing up for the patient portal called "MyChart".  Sign up information is provided on this After Visit Summary.  MyChart is used to connect with patients for Virtual Visits (Telemedicine).  Patients are able to view lab/test results, encounter notes, upcoming appointments, etc.  Non-urgent messages can be sent to your provider as well.   To learn more about what you can do with MyChart, go to [email or web address].    Your next appointment:   2 week(s)  The format for your next appointment:   In Person  Provider:   Jenne Campus, MD   Other Instructions

## 2020-01-21 ENCOUNTER — Telehealth: Payer: Self-pay | Admitting: Cardiology

## 2020-01-21 LAB — BASIC METABOLIC PANEL
BUN/Creatinine Ratio: 15 (ref 12–28)
BUN: 16 mg/dL (ref 8–27)
CO2: 26 mmol/L (ref 20–29)
Calcium: 10.3 mg/dL (ref 8.7–10.3)
Chloride: 104 mmol/L (ref 96–106)
Creatinine, Ser: 1.06 mg/dL — ABNORMAL HIGH (ref 0.57–1.00)
GFR calc Af Amer: 60 mL/min/{1.73_m2} (ref >59)
GFR calc non Af Amer: 52 mL/min/{1.73_m2} — ABNORMAL LOW (ref >59)
Glucose: 110 mg/dL — ABNORMAL HIGH (ref 65–99)
Potassium: 4.7 mmol/L (ref 3.5–5.2)
Sodium: 145 mmol/L — ABNORMAL HIGH (ref 134–144)

## 2020-01-21 MED ORDER — FUROSEMIDE 40 MG PO TABS: 80.0000 mg | ORAL_TABLET | Freq: Every day | ORAL | 3 refills | Status: DC

## 2020-01-21 NOTE — Telephone Encounter (Signed)
Patient states she is using the [place] pharmacy for her meds. She said they do mail order there and she needs the LASIX RX changed to 90 days. All of her meds need to be for 90 days when sent to the [place].

## 2020-01-21 NOTE — Telephone Encounter (Signed)
Refill sent in to [facility] per request.

## 2020-02-04 ENCOUNTER — Other Ambulatory Visit: Payer: Self-pay

## 2020-02-04 ENCOUNTER — Ambulatory Visit (INDEPENDENT_AMBULATORY_CARE_PROVIDER_SITE_OTHER): Payer: Medicare Other | Admitting: Cardiology

## 2020-02-04 ENCOUNTER — Encounter: Payer: Self-pay | Admitting: Cardiology

## 2020-02-04 VITALS — BP 102/78 | HR 82 | Ht 62.0 in | Wt 186.4 lb

## 2020-02-04 DIAGNOSIS — Z9889 Other specified postprocedural states: Secondary | ICD-10-CM | POA: Diagnosis not present

## 2020-02-04 DIAGNOSIS — I4819 Other persistent atrial fibrillation: Secondary | ICD-10-CM

## 2020-02-04 DIAGNOSIS — I509 Heart failure, unspecified: Secondary | ICD-10-CM | POA: Insufficient documentation

## 2020-02-04 DIAGNOSIS — G459 Transient cerebral ischemic attack, unspecified: Secondary | ICD-10-CM | POA: Diagnosis not present

## 2020-02-04 DIAGNOSIS — I340 Nonrheumatic mitral (valve) insufficiency: Secondary | ICD-10-CM

## 2020-02-04 DIAGNOSIS — I5032 Chronic diastolic (congestive) heart failure: Secondary | ICD-10-CM

## 2020-02-04 DIAGNOSIS — Z8679 Personal history of other diseases of the circulatory system: Secondary | ICD-10-CM

## 2020-02-04 HISTORY — DX: Heart failure, unspecified: I50.9

## 2020-02-04 NOTE — Patient Instructions (Signed)
Medication Instructions:  Your physician has recommended you make the following change in your medication:  STOP: amlodipine  *If you need a refill on your cardiac medications before your next appointment, please call your pharmacy*   Lab Work: Your physician recommends that you return for lab work today: bmp, pro bnp   If you have labs (blood work) drawn today and your tests are completely normal, you will receive your results only by: . MyChart Message (if you have MyChart) OR . A paper copy in the mail If you have any lab test that is abnormal or we need to change your treatment, we will call you to review the results.   Testing/Procedures: None.    Follow-Up: At [facility], you and your health needs are our priority.  As part of our continuing mission to provide you with exceptional heart care, we have created designated Provider Care Teams.  These Care Teams include your primary Cardiologist (physician) and Advanced Practice Providers (APPs -  Physician Assistants and Nurse Practitioners) who all work together to provide you with the care you need, when you need it.  We recommend signing up for the patient portal called "MyChart".  Sign up information is provided on this After Visit Summary.  MyChart is used to connect with patients for Virtual Visits (Telemedicine).  Patients are able to view lab/test results, encounter notes, upcoming appointments, etc.  Non-urgent messages can be sent to your provider as well.   To learn more about what you can do with MyChart, go to [email or web address].    Your next appointment:   1 month(s)  The format for your next appointment:   In Person  Provider:   Robert Krasowski, MD   Other Instructions   

## 2020-02-04 NOTE — Progress Notes (Signed)
Cardiology Office Note:    Date:  02/04/2020   ID:  [name], DOB 09/05/1946, MRN [id number]  PCP:  Raina Mina., MD  Cardiologist:  Jenne Campus, MD    Referring MD: Raina Mina., MD   Chief Complaint  Patient presents with  . Follow-up    2 WK FU   I am doing better but still tired and exhausted  History of Present Illness:    Kim Rice is a 74 y.o. female with past medical history significant for paroxysmal atrial fibrillation, status post atrial fibrillation ablation done more than 10 years ago, after that anticoagulation has been withdrawn and then she presented about 3 months ago to [facility] with TIA, she was found to be back in atrial fibrillation.  At the same time she was find to have moderate mitral regurgitation.  She was anticoagulated atrial fibrillation was controlled with AV blocking agents she was discharged home.  She was seen by me a few weeks ago when she was complaining of having shortness of breath.  I did give her diuretics after that she said I feel slightly better but still not completely back to normal.  Now she is better she is able to do a little bit more but still tired exhausted and have difficulty moving around and doing things.  Swelling of lower extremities is gone.  Denies have any palpitations no dizziness.  Noted episodes of profound fatigue and tiredness.  Past Medical History:  Diagnosis Date  . Abnormal myocardial perfusion study 10/18/2017  . Anemia    after hysterectomy at age 33  . Anxiety   . Aortic atherosclerosis ([place]) 09/15/2019  . Aortic valve regurgitation, acquired    01/18/14 TEE ([facility]): mild AI, no AS  . Arthritis    osteoarthritis  . Asthma   . Atrial fibrillation and flutter ([place])   . Chronic right-sided low back pain without sciatica 02/29/2016  . Chronic right-sided thoracic back pain 02/29/2016  . Class 1 obesity due to excess calories with serious comorbidity and body mass index (BMI) of 33.0 to 33.9  in adult 01/17/2016  . Degenerative cervical disc 06/06/2018  . Essential hypertension 01/17/2016  . Family history of adverse reaction to anesthesia    dad said during eye surgery he felt everything  . Frequent UTI    hx of - none for a year (as of 01/05/15)  . GERD (gastroesophageal reflux disease)   . H/O seasonal allergies   . High risk medication use 01/17/2016  . History of blood transfusion 09/14/2016  . History of Graves' disease 01/17/2016  . Hyperlipidemia   . Hypertension   . Hypothyroidism   . Late effect of cerebrovascular accident (CVA) 11/02/2019  . Lipoma    just under right breast  . Major depressive disorder 01/17/2016  . Mild aortic stenosis 10/18/2017  . Mitral regurgitation moderate by echocardiogram from 2021 01/17/2016  . Mitral valve regurgitation    Trace MR/TR by 01/18/14 TEE ([facility])  . Mixed hyperlipidemia 01/17/2016  . OA (osteoarthritis) 01/17/2016  . Obesity (BMI 30-39.9)   . Osteoarthritis of left knee 01/18/2015  . PAF (paroxysmal atrial fibrillation) ([place]) 01/17/2016   Formatting of this note might be different from the original. Ablation 2015.  TIA 08/2019.  Now on eliquis.  . Persistent atrial fibrillation ([place]) 09/30/2019  . Postmenopausal 01/17/2016  . Prediabetes   . Primary hyperparathyroidism ([place]) 01/17/2016   Formatting of this note might be different from the original. Negative w/u in 2015  Abnormal 2021.  . S/P cryoablation of arrhythmia 10/18/2017  . Status post ablation of atrial fibrillation 09/30/2019  . 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 Thyroid disease    was hyperthyroid, had radiation "Graves Disease"  . TIA (transient ischemic attack) 09/15/2019  . Tinea corporis 01/17/2016    Past Surgical History:  Procedure Laterality Date  . APPENDECTOMY  1980   during exploratory surgery for endometriosis  . ATRIAL FIBRILLATION ABLATION  01/30/14  . BACK SURGERY  1981   lumbar disc removed  . CARDIOVERSION  06/25/13  . COLONOSCOPY    . KNEE  ARTHROSCOPY W/ MENISCAL REPAIR Left 2014  . ROTATOR CUFF REPAIR Left   . throat polyps removed  1979  . TONSILLECTOMY    . TOTAL KNEE ARTHROPLASTY Left 01/18/2015   Procedure: LEFT TOTAL KNEE ARTHROPLASTY;  Surgeon: Mcarthur Rossetti, MD;  Location: [facility];  Service: Orthopedics;  Laterality: Left;  Marland Kitchen VAGINAL HYSTERECTOMY  1976    Current Medications: Current Meds  Medication Sig  . amLODipine (NORVASC) 5 MG tablet Take 5 mg by mouth 2 (two) times daily.  Marland Kitchen apixaban (ELIQUIS) 5 MG TABS tablet Take 5 mg by mouth 2 (two) times daily.  Marland Kitchen aspirin 81 MG tablet Take 81 mg by mouth daily.  Marland Kitchen atenolol (TENORMIN) 100 MG tablet Take 100 mg by mouth daily.  Marland Kitchen atorvastatin (LIPITOR) 80 MG tablet Take 80 mg by mouth daily.   Marland Kitchen escitalopram (LEXAPRO) 10 MG tablet Take 10 mg by mouth at bedtime.   Marland Kitchen esomeprazole (NEXIUM) 40 MG capsule Take 40 mg by mouth every evening.   . fluticasone (FLONASE) 50 MCG/ACT nasal spray Place 1 spray into both nostrils as needed.   . Fluticasone-Salmeterol (ADVAIR) 250-50 MCG/DOSE AEPB Inhale 1 puff into the lungs 2 (two) times daily as needed (wheezing).  . furosemide (LASIX) 40 MG tablet Take 2 tablets (80 mg total) by mouth daily.  Marland Kitchen levothyroxine (SYNTHROID) 75 MCG tablet Take by mouth daily before breakfast.   . lisinopril (ZESTRIL) 10 MG tablet Take 5 mg by mouth daily.      Allergies:   Codeine and Latex   Social History   Socioeconomic History  . Marital status: Married    Spouse name: Not on file  . Number of children: Not on file  . Years of education: Not on file  . Highest education level: Not on file  Occupational History  . Not on file  Tobacco Use  . Smoking status: Former Smoker    Quit date: 01/04/1985    Years since quitting: 35.1  . Smokeless tobacco: Never Used  Substance and Sexual Activity  . Alcohol use: Yes    Comment: occasional  . Drug use: No  . Sexual activity: Not on file  Other Topics Concern  . Not on file  Social  History Narrative  . Not on file   Social Determinants of Health   Financial Resource Strain:   . Difficulty of Paying Living Expenses:   Food Insecurity:   . Worried About Charity fundraiser in the Last Year:   . Arboriculturist in the Last Year:   Transportation Needs:   . Film/video editor (Medical):   Marland Kitchen Lack of Transportation (Non-Medical):   Physical Activity:   . Days of Exercise per Week:   . Minutes of Exercise per Session:   Stress:   . Feeling of Stress :   Social Connections:   . Frequency  of Communication with Friends and Family:   . Frequency of Social Gatherings with Friends and Family:   . Attends Religious Services:   . Active Member of Clubs or Organizations:   . Attends Archivist Meetings:   Marland Kitchen Marital Status:      Family History: The patient's family history includes Autism spectrum disorder in her son; CAD in her mother; COPD in her daughter; Cancer in her father; Crohn's disease in her daughter; Heart attack in her mother; Heart disease in her father; High Cholesterol in her brother, brother, daughter, and father; Hypertension in her brother, brother, and mother. ROS:   Please see the history of present illness.    All 14 point review of systems negative except as described per history of present illness  EKGs/Labs/Other Studies Reviewed:      Recent Labs: 12/31/2019: NT-Pro BNP 1,344 01/21/2020: BUN 16; Creatinine, Ser 1.06; Potassium 4.7; Sodium 145  Recent Lipid Panel No results found for: CHOL, TRIG, HDL, CHOLHDL, VLDL, LDLCALC, LDLDIRECT  Physical Exam:    VS:  BP 102/78   Pulse 82   Ht 5\' 2"  (1.575 m)   Wt 186 lb 6.4 oz (84.6 kg)   SpO2 99%   BMI 34.09 kg/m     Wt Readings from Last 3 Encounters:  02/04/20 186 lb 6.4 oz (84.6 kg)  01/19/20 185 lb (83.9 kg)  12/31/19 186 lb 9.6 oz (84.6 kg)     GEN:  Well nourished, well developed in no acute distress HEENT: Normal NECK: No JVD; No carotid bruits LYMPHATICS: No  lymphadenopathy CARDIAC: Irregular irregular with controlled ventricular rate, no murmurs, no rubs, no gallops RESPIRATORY:  Clear to auscultation without rales, wheezing or rhonchi  ABDOMEN: Soft, non-tender, non-distended MUSCULOSKELETAL:  No edema; No deformity  SKIN: Warm and dry LOWER EXTREMITIES: no swelling NEUROLOGIC:  Alert and oriented x 3 PSYCHIATRIC:  Normal affect   ASSESSMENT:    1. TIA (transient ischemic attack)   2. Persistent atrial fibrillation ([place])   3. Nonrheumatic mitral valve regurgitation   4. S/P cryoablation of arrhythmia   5. Chronic diastolic congestive heart failure ([place])    PLAN:    In order of problems listed above:  1. TIA.  She is anticoagulated which I will continue. 2. Persistent atrial fibrillation with controlled ventricular rate, EKG will be done today to check the rhythm she is anticoagulated which I will continue.  The question about potentially converting her to sinus rhythm is still ongoing.  I do not want to do that with and she still mildly decompensated from congestive heart failure point of view.  We will revisit that issue when she will be here next time 3. Nonrheumatic mitral regurgitation, moderate in degree.  Diuresis seems to be helping, however now effacing situation with blood pressure being low.  I will discontinue amlodipine, I will check proBNP as well as Chem-7 today. 4. Chronic congestive heart failure appears to be better but still mildly decompensated plan as outlined above   Medication Adjustments/Labs and Tests Ordered: Current medicines are reviewed at length with the patient today.  Concerns regarding medicines are outlined above.  No orders of the defined types were placed in this encounter.  Medication changes: No orders of the defined types were placed in this encounter.   Signed, Park Liter, MD, [facility] 02/04/2020 4:51 PM    [place]

## 2020-02-05 ENCOUNTER — Telehealth: Payer: Self-pay

## 2020-02-05 LAB — BASIC METABOLIC PANEL
BUN/Creatinine Ratio: 17 (ref 12–28)
BUN: 18 mg/dL (ref 8–27)
CO2: 25 mmol/L (ref 20–29)
Calcium: 10 mg/dL (ref 8.7–10.3)
Chloride: 101 mmol/L (ref 96–106)
Creatinine, Ser: 1.03 mg/dL — ABNORMAL HIGH (ref 0.57–1.00)
GFR calc Af Amer: 62 mL/min/{1.73_m2} (ref >59)
GFR calc non Af Amer: 54 mL/min/{1.73_m2} — ABNORMAL LOW (ref >59)
Glucose: 101 mg/dL — ABNORMAL HIGH (ref 65–99)
Potassium: 4.7 mmol/L (ref 3.5–5.2)
Sodium: 143 mmol/L (ref 134–144)

## 2020-02-05 LAB — PRO B NATRIURETIC PEPTIDE: NT-Pro BNP: 1333 pg/mL — ABNORMAL HIGH (ref 0–301)

## 2020-02-05 NOTE — Telephone Encounter (Signed)
-----   Message from Park Liter, MD sent at 02/05/2020  8:40 AM EDT ----- Laboratories acceptable, continue present management.

## 2020-02-05 NOTE — Telephone Encounter (Signed)
Spoke with patient regarding results and recommendation.  Patient verbalizes understanding and is agreeable to plan of care. Advised patient to call back with any issues or concerns.  

## 2020-03-16 ENCOUNTER — Other Ambulatory Visit: Payer: Self-pay

## 2020-03-16 ENCOUNTER — Ambulatory Visit (INDEPENDENT_AMBULATORY_CARE_PROVIDER_SITE_OTHER): Payer: Medicare Other | Admitting: Cardiology

## 2020-03-16 ENCOUNTER — Encounter: Payer: Self-pay | Admitting: Cardiology

## 2020-03-16 VITALS — BP 150/82 | HR 53 | Ht 62.0 in | Wt 184.2 lb

## 2020-03-16 DIAGNOSIS — Z9889 Other specified postprocedural states: Secondary | ICD-10-CM

## 2020-03-16 DIAGNOSIS — I10 Essential (primary) hypertension: Secondary | ICD-10-CM | POA: Diagnosis not present

## 2020-03-16 DIAGNOSIS — I340 Nonrheumatic mitral (valve) insufficiency: Secondary | ICD-10-CM

## 2020-03-16 DIAGNOSIS — I350 Nonrheumatic aortic (valve) stenosis: Secondary | ICD-10-CM | POA: Diagnosis not present

## 2020-03-16 DIAGNOSIS — I4819 Other persistent atrial fibrillation: Secondary | ICD-10-CM

## 2020-03-16 DIAGNOSIS — I5032 Chronic diastolic (congestive) heart failure: Secondary | ICD-10-CM

## 2020-03-16 DIAGNOSIS — Z8679 Personal history of other diseases of the circulatory system: Secondary | ICD-10-CM

## 2020-03-16 NOTE — Addendum Note (Signed)
Addended by: Ashok Norris on: 03/16/2020 11:37 AM   Modules accepted: Orders

## 2020-03-16 NOTE — Progress Notes (Signed)
Cardiology Office Note:    Date:  03/16/2020   ID:  Kim Rice, DOB 18-Apr-1946, MRN [id number]  PCP:  Raina Mina., MD  Cardiologist:  Jenne Campus, MD    Referring MD: Raina Mina., MD   No chief complaint on file. I am not doing well  History of Present Illness:    Kim Rice is a 74 y.o. female with past medical history significant for paroxysmal atrial fibrillation, she did have cryoablation done years ago she was anticoagulated then anticoagulation was withdrawn.  At the end of last year she presented to our hospital with TIA and she was find to be again in atrial fibrillation.  She was also identified to have at least moderate mitral regurgitation.  She was put on appropriate medication with intention to decide about if we need to convert her to sinus rhythm.  She comes today to my office and she is short of breath and she complained of not feeling well.  Described to have lack of energy no swelling of lower extremities no proximal nocturnal dyspnea just shortness of breath as a leading complaint.  Past Medical History:  Diagnosis Date  . Abnormal myocardial perfusion study 10/18/2017  . Anemia    after hysterectomy at age 22  . Anxiety   . Aortic atherosclerosis ([place]) 09/15/2019  . Aortic valve regurgitation, acquired    01/18/14 TEE ([facility]): mild AI, no AS  . Arthritis    osteoarthritis  . Asthma   . Atrial fibrillation and flutter ([place])   . Chronic right-sided low back pain without sciatica 02/29/2016  . Chronic right-sided thoracic back pain 02/29/2016  . Class 1 obesity due to excess calories with serious comorbidity and body mass index (BMI) of 33.0 to 33.9 in adult 01/17/2016  . Degenerative cervical disc 06/06/2018  . Essential hypertension 01/17/2016  . Family history of adverse reaction to anesthesia    dad said during eye surgery he felt everything  . Frequent UTI    hx of - none for a year (as of 01/05/15)  . GERD (gastroesophageal reflux disease)   .  H/O seasonal allergies   . High risk medication use 01/17/2016  . History of blood transfusion 09/14/2016  . History of Graves' disease 01/17/2016  . Hyperlipidemia   . Hypertension   . Hypothyroidism   . Late effect of cerebrovascular accident (CVA) 11/02/2019  . Lipoma    just under right breast  . Major depressive disorder 01/17/2016  . Mild aortic stenosis 10/18/2017  . Mitral regurgitation moderate by echocardiogram from 2021 01/17/2016  . Mitral valve regurgitation    Trace MR/TR by 01/18/14 TEE ([facility])  . Mixed hyperlipidemia 01/17/2016  . OA (osteoarthritis) 01/17/2016  . Obesity (BMI 30-39.9)   . Osteoarthritis of left knee 01/18/2015  . PAF (paroxysmal atrial fibrillation) ([place]) 01/17/2016   Formatting of this note might be different from the original. Ablation 2015.  TIA 08/2019.  Now on eliquis.  . Persistent atrial fibrillation ([place]) 09/30/2019  . Postmenopausal 01/17/2016  . Prediabetes   . Primary hyperparathyroidism ([place]) 01/17/2016   Formatting of this note might be different from the original. Negative w/u in 2015 Abnormal 2021.  . S/P cryoablation of arrhythmia 10/18/2017  . Status post ablation of atrial fibrillation 09/30/2019  . 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 Thyroid disease    was hyperthyroid, had radiation "Graves Disease"  . TIA (transient ischemic attack) 09/15/2019  . Tinea corporis 01/17/2016    Past Surgical  History:  Procedure Laterality Date  . APPENDECTOMY  1980   during exploratory surgery for endometriosis  . ATRIAL FIBRILLATION ABLATION  01/30/14  . BACK SURGERY  1981   lumbar disc removed  . CARDIOVERSION  06/25/13  . COLONOSCOPY    . KNEE ARTHROSCOPY W/ MENISCAL REPAIR Left 2014  . ROTATOR CUFF REPAIR Left   . throat polyps removed  1979  . TONSILLECTOMY    . TOTAL KNEE ARTHROPLASTY Left 01/18/2015   Procedure: LEFT TOTAL KNEE ARTHROPLASTY;  Surgeon: Mcarthur Rossetti, MD;  Location: [facility];  Service: Orthopedics;  Laterality: Left;  Marland Kitchen  VAGINAL HYSTERECTOMY  1976    Current Medications: Current Meds  Medication Sig  . apixaban (ELIQUIS) 5 MG TABS tablet Take 5 mg by mouth 2 (two) times daily.  Marland Kitchen aspirin 81 MG tablet Take 81 mg by mouth daily.  Marland Kitchen atenolol (TENORMIN) 100 MG tablet Take 100 mg by mouth daily.  Marland Kitchen atorvastatin (LIPITOR) 80 MG tablet Take 80 mg by mouth daily.   Marland Kitchen escitalopram (LEXAPRO) 10 MG tablet Take 10 mg by mouth at bedtime.   Marland Kitchen esomeprazole (NEXIUM) 40 MG capsule Take 40 mg by mouth every evening.   . fluticasone (FLONASE) 50 MCG/ACT nasal spray Place 1 spray into both nostrils as needed.   . Fluticasone-Salmeterol (ADVAIR) 250-50 MCG/DOSE AEPB Inhale 1 puff into the lungs 2 (two) times daily as needed (wheezing).  . furosemide (LASIX) 40 MG tablet Take 2 tablets (80 mg total) by mouth daily.  Marland Kitchen levothyroxine (SYNTHROID) 75 MCG tablet Take by mouth daily before breakfast.   . lisinopril (ZESTRIL) 10 MG tablet Take 5 mg by mouth daily.      Allergies:   Codeine and Latex   Social History   Socioeconomic History  . Marital status: Widowed    Spouse name: Not on file  . Number of children: Not on file  . Years of education: Not on file  . Highest education level: Not on file  Occupational History  . Not on file  Tobacco Use  . Smoking status: Former Smoker    Quit date: 01/04/1985    Years since quitting: 35.2  . Smokeless tobacco: Never Used  Vaping Use  . Vaping Use: Never used  Substance and Sexual Activity  . Alcohol use: Yes    Comment: occasional  . Drug use: No  . Sexual activity: Not on file  Other Topics Concern  . Not on file  Social History Narrative  . Not on file   Social Determinants of Health   Financial Resource Strain:   . Difficulty of Paying Living Expenses:   Food Insecurity:   . Worried About Charity fundraiser in the Last Year:   . Arboriculturist in the Last Year:   Transportation Needs:   . Film/video editor (Medical):   Marland Kitchen Lack of Transportation  (Non-Medical):   Physical Activity:   . Days of Exercise per Week:   . Minutes of Exercise per Session:   Stress:   . Feeling of Stress :   Social Connections:   . Frequency of Communication with Friends and Family:   . Frequency of Social Gatherings with Friends and Family:   . Attends Religious Services:   . Active Member of Clubs or Organizations:   . Attends Archivist Meetings:   Marland Kitchen Marital Status:      Family History: The patient's family history includes Autism spectrum disorder in her son; CAD  in her mother; COPD in her daughter; Cancer in her father; Crohn's disease in her daughter; Heart attack in her mother; Heart disease in her father; High Cholesterol in her brother, brother, daughter, and father; Hypertension in her brother, brother, and mother. ROS:   Please see the history of present illness.    All 14 point review of systems negative except as described per history of present illness  EKGs/Labs/Other Studies Reviewed:      Recent Labs: 02/04/2020: BUN 18; Creatinine, Ser 1.03; NT-Pro BNP 1,333; Potassium 4.7; Sodium 143  Recent Lipid Panel No results found for: CHOL, TRIG, HDL, CHOLHDL, VLDL, LDLCALC, LDLDIRECT  Physical Exam:    VS:  BP (!) 150/82 (BP Location: Left Arm, Patient Position: Sitting, Cuff Size: Normal)   Pulse (!) 53   Ht 5\' 2"  (1.575 m)   Wt 184 lb 3.2 oz (83.6 kg)   SpO2 96%   BMI 33.69 kg/m     Wt Readings from Last 3 Encounters:  03/16/20 184 lb 3.2 oz (83.6 kg)  02/04/20 186 lb 6.4 oz (84.6 kg)  01/19/20 185 lb (83.9 kg)     GEN:  Well nourished, well developed in no acute distress HEENT: Normal NECK: No JVD; No carotid bruits LYMPHATICS: No lymphadenopathy CARDIAC: Irregularly irregular, systolic murmur grade 2/6 best heard left border of the sternum, no rubs, no gallops RESPIRATORY:  Clear to auscultation without rales, wheezing or rhonchi  ABDOMEN: Soft, non-tender, non-distended MUSCULOSKELETAL:  No edema; No  deformity  SKIN: Warm and dry LOWER EXTREMITIES: no swelling NEUROLOGIC:  Alert and oriented x 3 PSYCHIATRIC:  Normal affect   ASSESSMENT:    1. Persistent atrial fibrillation ([place])   2. Nonrheumatic mitral valve regurgitation   3. Mild aortic stenosis   4. Essential hypertension   5. Chronic diastolic congestive heart failure ([place])   6. S/P cryoablation of arrhythmia    PLAN:    In order of problems listed above:  1. Persistent atrial fibrillation.  Rate appears to be controlled.  She feels weak tired exhausted, she is anticoagulated which I will continue.  Of course the question is what part of her symptomatology is related to atrial fibrillation.  The plan will be I will repeat her echocardiogram to assess left ventricle ejection fraction size of the atrium and more importantly look at the degree of mitral regurgitation.  Based on that we will decide about course of action that course of action may include cardioversion. 2. Mild aortic stenosis: Again echocardiogram will be repeated. 3. Essential hypertension blood pressure slightly elevated today, however, she brought blood pressure measurements from home and is usually good. 4. Chronic diastolic congestive heart failure appears to be decompensated right now.  I will ask you to have Chem-7 done we will do proBNP and based on that we will decide what will be the course of action. 5. Status post cryoablation.  Noted   Medication Adjustments/Labs and Tests Ordered: Current medicines are reviewed at length with the patient today.  Concerns regarding medicines are outlined above.  No orders of the defined types were placed in this encounter.  Medication changes: No orders of the defined types were placed in this encounter.   Signed, Park Liter, MD, [facility] 03/16/2020 11:02 AM    [place]

## 2020-03-16 NOTE — Patient Instructions (Signed)
Medication Instructions:  Your physician recommends that you continue on your current medications as directed. Please refer to the Current Medication list given to you today.  *If you need a refill on your cardiac medications before your next appointment, please call your pharmacy*   Lab Work: Your physician recommends that you return for lab work today: bmp, pro bnp  If you have labs (blood work) drawn today and your tests are completely normal, you will receive your results only by: Marland Kitchen MyChart Message (if you have MyChart) OR . A paper copy in the mail If you have any lab test that is abnormal or we need to change your treatment, we will call you to review the results.   Testing/Procedures: Your physician has requested that you have an echocardiogram. Echocardiography is a painless test that uses sound waves to create images of your heart. It provides your doctor with information about the size and shape of your heart and how well your heart's chambers and valves are working. This procedure takes approximately one hour. There are no restrictions for this procedure.     Follow-Up: At [facility], you and your health needs are our priority.  As part of our continuing mission to provide you with exceptional heart care, we have created designated Provider Care Teams.  These Care Teams include your primary Cardiologist (physician) and Advanced Practice Providers (APPs -  Physician Assistants and Nurse Practitioners) who all work together to provide you with the care you need, when you need it.  We recommend signing up for the patient portal called "MyChart".  Sign up information is provided on this After Visit Summary.  MyChart is used to connect with patients for Virtual Visits (Telemedicine).  Patients are able to view lab/test results, encounter notes, upcoming appointments, etc.  Non-urgent messages can be sent to your provider as well.   To learn more about what you can do with MyChart,  go to [email or web address].    Your next appointment:   3 week(s)  The format for your next appointment:   In Person  Provider:   Jenne Campus, MD   Other Instructions   Echocardiogram An echocardiogram is a procedure that uses painless sound waves (ultrasound) to produce an image of the heart. Images from an echocardiogram can provide important information about:  Signs of coronary artery disease (CAD).  Aneurysm detection. An aneurysm is a weak or damaged part of an artery wall that bulges out from the normal force of blood pumping through the body.  Heart size and shape. Changes in the size or shape of the heart can be associated with certain conditions, including heart failure, aneurysm, and CAD.  Heart muscle function.  Heart valve function.  Signs of a past heart attack.  Fluid buildup around the heart.  Thickening of the heart muscle.  A tumor or infectious growth around the heart valves. Tell a health care provider about:  Any allergies you have.  All medicines you are taking, including vitamins, herbs, eye drops, creams, and over-the-counter medicines.  Any blood disorders you have.  Any surgeries you have had.  Any medical conditions you have.  Whether you are pregnant or may be pregnant. What are the risks? Generally, this is a safe procedure. However, problems may occur, including:  Allergic reaction to dye (contrast) that may be used during the procedure. What happens before the procedure? No specific preparation is needed. You may eat and drink normally. What happens during the procedure?   An IV  tube may be inserted into one of your veins.  You may receive contrast through this tube. A contrast is an injection that improves the quality of the pictures from your heart.  A gel will be applied to your chest.  A wand-like tool (transducer) will be moved over your chest. The gel will help to transmit the sound waves from the  transducer.  The sound waves will harmlessly bounce off of your heart to allow the heart images to be captured in real-time motion. The images will be recorded on a computer. The procedure may vary among health care providers and hospitals. What happens after the procedure?  You may return to your normal, everyday life, including diet, activities, and medicines, unless your health care provider tells you not to do that. Summary  An echocardiogram is a procedure that uses painless sound waves (ultrasound) to produce an image of the heart.  Images from an echocardiogram can provide important information about the size and shape of your heart, heart muscle function, heart valve function, and fluid buildup around your heart.  You do not need to do anything to prepare before this procedure. You may eat and drink normally.  After the echocardiogram is completed, you may return to your normal, everyday life, unless your health care provider tells you not to do that. This information is not intended to replace advice given to you by your health care provider. Make sure you discuss any questions you have with your health care provider. Document Revised: 12/25/2018 Document Reviewed: 10/06/2016 Elsevier Patient Education  [place].

## 2020-03-17 ENCOUNTER — Telehealth: Payer: Self-pay | Admitting: Cardiology

## 2020-03-17 ENCOUNTER — Telehealth: Payer: Self-pay | Admitting: Emergency Medicine

## 2020-03-17 LAB — BASIC METABOLIC PANEL
BUN/Creatinine Ratio: 24 (ref 12–28)
BUN: 26 mg/dL (ref 8–27)
CO2: 25 mmol/L (ref 20–29)
Calcium: 9.8 mg/dL (ref 8.7–10.3)
Chloride: 103 mmol/L (ref 96–106)
Creatinine, Ser: 1.07 mg/dL — ABNORMAL HIGH (ref 0.57–1.00)
GFR calc Af Amer: 60 mL/min/{1.73_m2} (ref >59)
GFR calc non Af Amer: 52 mL/min/{1.73_m2} — ABNORMAL LOW (ref >59)
Glucose: 118 mg/dL — ABNORMAL HIGH (ref 65–99)
Potassium: 4.2 mmol/L (ref 3.5–5.2)
Sodium: 146 mmol/L — ABNORMAL HIGH (ref 134–144)

## 2020-03-17 LAB — PRO B NATRIURETIC PEPTIDE: NT-Pro BNP: 1732 pg/mL — ABNORMAL HIGH (ref 0–301)

## 2020-03-17 MED ORDER — POTASSIUM CHLORIDE ER 10 MEQ PO TBCR: 10.0000 meq | EXTENDED_RELEASE_TABLET | Freq: Every day | ORAL | 1 refills | Status: DC

## 2020-03-17 MED ORDER — FUROSEMIDE 40 MG PO TABS: 80.0000 mg | ORAL_TABLET | Freq: Every day | ORAL | 1 refills | Status: DC

## 2020-03-17 NOTE — Telephone Encounter (Signed)
Please see additional phone call.  

## 2020-03-17 NOTE — Telephone Encounter (Signed)
Called patient informed her that she needs to increase her lasix to 120 mg every other day and add potassium to 10 meq daily and have labs drawn in 1 week. She verbally understood.   This differs from Dr. Wendy Poet result note because he didn't realize the patient was already taking lasix 80 mg daily.

## 2020-03-17 NOTE — Telephone Encounter (Signed)
New Message  Pt c/o medication issue:  1. Name of Medication: furosemide (LASIX) 40 MG tablet  2. How are you currently taking this medication (dosage and times per day)? 80 mg daily  3. Are you having a reaction (difficulty breathing--STAT)? No  4. What is your medication issue? Patient states that she was told to take 40 mg for swelling, per current prescription she already takes 80 mg, so patient wants to know if she should be taking an additional 40 mg on top of the 80 mg she already takes. Please give patient a call back today to advise.

## 2020-03-17 NOTE — Telephone Encounter (Signed)
-----   Message from Park Liter, MD sent at 03/17/2020  2:01 PM EDT ----- She does have evidence of congestive heart failure laboratory test.  Please start on Lasix 40 mg daily and 10 mEq of potassium, we need to do Chem-7 as well as proBNP in about a week

## 2020-03-17 NOTE — Addendum Note (Signed)
Addended by: Ashok Norris on: 03/17/2020 05:08 PM   Modules accepted: Orders

## 2020-03-17 NOTE — Addendum Note (Signed)
Addended by: Ashok Norris on: 03/17/2020 05:03 PM   Modules accepted: Orders

## 2020-03-25 LAB — BASIC METABOLIC PANEL
BUN/Creatinine Ratio: 18 (ref 12–28)
BUN: 18 mg/dL (ref 8–27)
CO2: 24 mmol/L (ref 20–29)
Calcium: 9.8 mg/dL (ref 8.7–10.3)
Chloride: 100 mmol/L (ref 96–106)
Creatinine, Ser: 0.98 mg/dL (ref 0.57–1.00)
GFR calc Af Amer: 66 mL/min/{1.73_m2} (ref >59)
GFR calc non Af Amer: 57 mL/min/{1.73_m2} — ABNORMAL LOW (ref >59)
Glucose: 107 mg/dL — ABNORMAL HIGH (ref 65–99)
Potassium: 4 mmol/L (ref 3.5–5.2)
Sodium: 142 mmol/L (ref 134–144)

## 2020-03-25 LAB — PRO B NATRIURETIC PEPTIDE: NT-Pro BNP: 755 pg/mL — ABNORMAL HIGH (ref 0–301)

## 2020-03-29 ENCOUNTER — Other Ambulatory Visit: Payer: Self-pay | Admitting: Cardiology

## 2020-03-29 MED ORDER — FUROSEMIDE 40 MG PO TABS: 80.0000 mg | ORAL_TABLET | Freq: Every day | ORAL | 3 refills | Status: DC

## 2020-03-29 NOTE — Telephone Encounter (Signed)
*  STAT* If patient is at the pharmacy, call can be transferred to refill team.   1. Which medications need to be refilled? (please list name of each medication and dose if known)  furosemide (LASIX) 40 MG tablet  2. Which pharmacy/location (including street and city if local pharmacy) is medication to be sent to? [facility] - [place], [address]  3. Do they need a 30 day or 90 day supply? 90 day supply

## 2020-03-29 NOTE — Telephone Encounter (Signed)
Refill sent in per request.  

## 2020-03-31 ENCOUNTER — Other Ambulatory Visit: Payer: Self-pay

## 2020-03-31 ENCOUNTER — Ambulatory Visit (INDEPENDENT_AMBULATORY_CARE_PROVIDER_SITE_OTHER): Payer: Medicare Other

## 2020-03-31 DIAGNOSIS — I340 Nonrheumatic mitral (valve) insufficiency: Secondary | ICD-10-CM | POA: Diagnosis not present

## 2020-03-31 DIAGNOSIS — I4819 Other persistent atrial fibrillation: Secondary | ICD-10-CM

## 2020-03-31 DIAGNOSIS — I5032 Chronic diastolic (congestive) heart failure: Secondary | ICD-10-CM

## 2020-03-31 LAB — ECHOCARDIOGRAM COMPLETE
AR max vel: 1.04 cm2
AV Area VTI: 1.14 cm2
AV Area mean vel: 1.15 cm2
AV Mean grad: 14 mmHg
AV Peak grad: 26 mmHg
Ao pk vel: 2.55 m/s
Area-P 1/2: 4.49 cm2
P 1/2 time: 430 msec
S' Lateral: 2.6 cm

## 2020-03-31 NOTE — Progress Notes (Signed)
Complete echocardiogram performed.  Jimmy Solyana Nonaka RDCS, RVT  

## 2020-04-01 ENCOUNTER — Other Ambulatory Visit: Payer: Self-pay | Admitting: Cardiology

## 2020-04-01 MED ORDER — POTASSIUM CHLORIDE ER 10 MEQ PO TBCR: 10.0000 meq | EXTENDED_RELEASE_TABLET | Freq: Every day | ORAL | 3 refills | Status: AC

## 2020-04-01 NOTE — Telephone Encounter (Signed)
New Message   *STAT* If patient is at the pharmacy, call can be transferred to refill team.   1. Which medications need to be refilled? (please list name of each medication and dose if known) potassium chloride (KLOR-CON) 10 MEQ tablet  2. Which pharmacy/location (including street and city if local pharmacy) is medication to be sent to? [facility] - [place], [address]  3. Do they need a 30 day or 90 day supply? 90 day

## 2020-04-01 NOTE — Telephone Encounter (Signed)
Refill sent in per request.  

## 2020-04-05 ENCOUNTER — Telehealth: Payer: Self-pay | Admitting: *Deleted

## 2020-04-05 MED ORDER — FUROSEMIDE 40 MG PO TABS: ORAL_TABLET | ORAL | 3 refills | Status: DC

## 2020-04-05 NOTE — Telephone Encounter (Signed)
Rx refill sent to pharmacy. Sent in response to need for clarification

## 2020-04-06 ENCOUNTER — Telehealth: Payer: Self-pay

## 2020-04-06 NOTE — Telephone Encounter (Signed)
Left message on patients voicemail to please return our call.   

## 2020-04-06 NOTE — Telephone Encounter (Signed)
-----   Message from Park Liter, MD sent at 04/06/2020 12:13 PM EDT ----- Echocardiogram showed preserved left ventricle ejection fraction, there is diastolic dysfunction, he does have aortic regurgitation as well as aortic stenosis none of this is critical.  Medical therapy

## 2020-04-07 ENCOUNTER — Telehealth: Payer: Self-pay | Admitting: Cardiology

## 2020-04-07 ENCOUNTER — Telehealth: Payer: Self-pay

## 2020-04-07 NOTE — Telephone Encounter (Signed)
Spoke with patient regarding results and recommendation.  Patient verbalizes understanding and is agreeable to plan of care. Advised patient to call back with any issues or concerns.  

## 2020-04-07 NOTE — Telephone Encounter (Signed)
Transferred call to [place] °

## 2020-04-21 ENCOUNTER — Other Ambulatory Visit: Payer: Self-pay

## 2020-04-21 ENCOUNTER — Ambulatory Visit (INDEPENDENT_AMBULATORY_CARE_PROVIDER_SITE_OTHER): Payer: Medicare Other | Admitting: Cardiology

## 2020-04-21 ENCOUNTER — Encounter: Payer: Self-pay | Admitting: Cardiology

## 2020-04-21 VITALS — BP 148/82 | HR 81 | Ht 62.0 in | Wt 183.0 lb

## 2020-04-21 DIAGNOSIS — I350 Nonrheumatic aortic (valve) stenosis: Secondary | ICD-10-CM | POA: Diagnosis not present

## 2020-04-21 DIAGNOSIS — I340 Nonrheumatic mitral (valve) insufficiency: Secondary | ICD-10-CM | POA: Diagnosis not present

## 2020-04-21 DIAGNOSIS — I4819 Other persistent atrial fibrillation: Secondary | ICD-10-CM

## 2020-04-21 DIAGNOSIS — Z9889 Other specified postprocedural states: Secondary | ICD-10-CM

## 2020-04-21 DIAGNOSIS — Z8679 Personal history of other diseases of the circulatory system: Secondary | ICD-10-CM

## 2020-04-21 DIAGNOSIS — I6930 Unspecified sequelae of cerebral infarction: Secondary | ICD-10-CM | POA: Diagnosis not present

## 2020-04-21 MED ORDER — FUROSEMIDE 80 MG PO TABS
120.0000 mg | ORAL_TABLET | Freq: Every day | ORAL | 2 refills | Status: DC
Start: 2020-04-21 — End: 2021-05-12

## 2020-04-21 NOTE — Patient Instructions (Addendum)
Medication Instructions:  Your physician has recommended you make the following change in your medication:  INCREASE: Furosemide 120 mg daily DECREASE: Lisinopril to 2.5 mg per 1/2 tablet daily.  CHANGE: Atenolol 50 mg per 1/2 tablet by mouth twice daily.   *If you need a refill on your cardiac medications before your next appointment, please call your pharmacy*   Lab Work: Your physician recommends that you return for lab work on Monday BMP If you have labs (blood work) drawn today and your tests are completely normal, you will receive your results only by: Marland Kitchen MyChart Message (if you have MyChart) OR . A paper copy in the mail If you have any lab test that is abnormal or we need to change your treatment, we will call you to review the results.   Testing/Procedures: None   Follow-Up: At [facility], you and your health needs are our priority.  As part of our continuing mission to provide you with exceptional heart care, we have created designated Provider Care Teams.  These Care Teams include your primary Cardiologist (physician) and Advanced Practice Providers (APPs -  Physician Assistants and Nurse Practitioners) who all work together to provide you with the care you need, when you need it.  We recommend signing up for the patient portal called "MyChart".  Sign up information is provided on this After Visit Summary.  MyChart is used to connect with patients for Virtual Visits (Telemedicine).  Patients are able to view lab/test results, encounter notes, upcoming appointments, etc.  Non-urgent messages can be sent to your provider as well.   To learn more about what you can do with MyChart, go to [email or web address].    Your next appointment:   3 WEEK  The format for your next appointment:   In Person  Provider:   Dr. Agustin Cree   Other Instructions

## 2020-04-21 NOTE — Progress Notes (Signed)
Cardiology Office Note:    Date:  04/21/2020    ID:  Kim Rice, DOB 1946/09/03, MRN [id number]  PCP:  Kim Rice., MD  Cardiologist:  Kim Campus, MD    Referring MD: Kim Rice., MD   No chief complaint on file. I am still have bad days  History of Present Illness:    Kim Rice is a 74 y.o. female with past medical history significant for paroxysmal atrial fibrillation, she did have atrial fibrillation ablation done years ago after that she seems to maintain sinus rhythm she was taken off anticoagulation, however, she presented to our hospital in December with new TIA.  Luckily she did not have any permanent neurological sequelae of it, she also was found to have mild aortic stenosis as well as moderate mitral regurgitation.  She comes today to my office for follow-up.  She is doing fair she said she got weak days and bad days.  Sometimes she feels very tired and exhausted interestingly when she check it she see her blood pressure being low she does not see any tachycardia Of course the most difficult question is what part of this is related to atrial fibrillation or part of this is related to something else.  Recently I repeated her echocardiogram to look at the degree of mitral regurgitation, likely is only mild.  Some valvular pathology does not explain her symptomatology.  We talked in length about what to do with the situation we decided to him increase diuresis to Lasix 120 mg daily and see if she feels any better.  If not then we will make arrangements for electrical cardioversion.  Past Medical History:  Diagnosis Date  . Abnormal myocardial perfusion study 10/18/2017  . Anemia    after hysterectomy at age 76  . Anxiety   . Aortic atherosclerosis ([place]) 09/15/2019  . Aortic valve regurgitation, acquired    01/18/14 TEE ([facility]): mild AI, no AS  . Arthritis    osteoarthritis  . Asthma   . Atrial fibrillation and flutter ([place])   . Chronic right-sided low back  pain without sciatica 02/29/2016  . Chronic right-sided thoracic back pain 02/29/2016  . Class 1 obesity due to excess calories with serious comorbidity and body mass index (BMI) of 33.0 to 33.9 in adult 01/17/2016  . Degenerative cervical disc 06/06/2018  . Essential hypertension 01/17/2016  . Family history of adverse reaction to anesthesia    dad said during eye surgery he felt everything  . Frequent UTI    hx of - none for a year (as of 01/05/15)  . GERD (gastroesophageal reflux disease)   . H/O seasonal allergies   . High risk medication use 01/17/2016  . History of blood transfusion 09/14/2016  . History of Graves' disease 01/17/2016  . Hyperlipidemia   . Hypertension   . Hypothyroidism   . Late effect of cerebrovascular accident (CVA) 11/02/2019  . Lipoma    just under right breast  . Major depressive disorder 01/17/2016  . Mild aortic stenosis 10/18/2017  . Mitral regurgitation moderate by echocardiogram from 2021 01/17/2016  . Mitral valve regurgitation    Trace MR/TR by 01/18/14 TEE ([facility])  . Mixed hyperlipidemia 01/17/2016  . OA (osteoarthritis) 01/17/2016  . Obesity (BMI 30-39.9)   . Osteoarthritis of left knee 01/18/2015  . PAF (paroxysmal atrial fibrillation) ([place]) 01/17/2016   Formatting of this note might be different from the original. Ablation 2015.  TIA 08/2019.  Now on eliquis.  . Persistent atrial fibrillation (  [place]) 09/30/2019  . Postmenopausal 01/17/2016  . Prediabetes   . Primary hyperparathyroidism ([place]) 01/17/2016   Formatting of this note might be different from the original. Negative w/u in 2015 Abnormal 2021.  . S/P cryoablation of arrhythmia 10/18/2017  . Status post ablation of atrial fibrillation 09/30/2019  . 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 Thyroid disease    was hyperthyroid, had radiation "Graves Disease"  . TIA (transient ischemic attack) 09/15/2019  . Tinea corporis 01/17/2016    Past Surgical History:  Procedure Laterality Date  . APPENDECTOMY  1980    during exploratory surgery for endometriosis  . ATRIAL FIBRILLATION ABLATION  01/30/14  . BACK SURGERY  1981   lumbar disc removed  . CARDIOVERSION  06/25/13  . COLONOSCOPY    . KNEE ARTHROSCOPY W/ MENISCAL REPAIR Left 2014  . ROTATOR CUFF REPAIR Left   . throat polyps removed  1979  . TONSILLECTOMY    . TOTAL KNEE ARTHROPLASTY Left 01/18/2015   Procedure: LEFT TOTAL KNEE ARTHROPLASTY;  Surgeon: Mcarthur Rossetti, MD;  Location: [facility];  Service: Orthopedics;  Laterality: Left;  Marland Kitchen VAGINAL HYSTERECTOMY  1976    Current Medications: Current Meds  Medication Sig  . apixaban (ELIQUIS) 5 MG TABS tablet Take 5 mg by mouth 2 (two) times daily.  Marland Kitchen aspirin 81 MG tablet Take 81 mg by mouth daily.  Marland Kitchen atenolol (TENORMIN) 100 MG tablet Take 100 mg by mouth daily.  Marland Kitchen atorvastatin (LIPITOR) 80 MG tablet Take 80 mg by mouth daily.   Marland Kitchen escitalopram (LEXAPRO) 10 MG tablet Take 10 mg by mouth at bedtime.   Marland Kitchen esomeprazole (NEXIUM) 40 MG capsule Take 40 mg by mouth every evening.   . fluticasone (FLONASE) 50 MCG/ACT nasal spray Place 1 spray into both nostrils as needed.   . Fluticasone-Salmeterol (ADVAIR) 250-50 MCG/DOSE AEPB Inhale 1 puff into the lungs 2 (two) times daily as needed (wheezing).  . furosemide (LASIX) 40 MG tablet Take 2 tabs (80 mg) by mouth every other day alternating with 3 tabs (120 mg) every other day.  . levothyroxine (SYNTHROID) 75 MCG tablet Take by mouth daily before breakfast.   . lisinopril (ZESTRIL) 10 MG tablet Take 5 mg by mouth daily.   . potassium chloride (KLOR-CON) 10 MEQ tablet Take 1 tablet (10 mEq total) by mouth daily.     Allergies:   Codeine and Latex   Social History   Socioeconomic History  . Marital status: Widowed    Spouse name: Not on file  . Number of children: Not on file  . Years of education: Not on file  . Highest education level: Not on file  Occupational History  . Not on file  Tobacco Use  . Smoking status: Former Smoker    Quit date:  01/04/1985    Years since quitting: 35.3  . Smokeless tobacco: Never Used  Vaping Use  . Vaping Use: Never used  Substance and Sexual Activity  . Alcohol use: Yes    Comment: occasional  . Drug use: No  . Sexual activity: Not on file  Other Topics Concern  . Not on file  Social History Narrative  . Not on file   Social Determinants of Health   Financial Resource Strain:   . Difficulty of Paying Living Expenses:   Food Insecurity:   . Worried About Charity fundraiser in the Last Year:   . Arboriculturist in the Last Year:   Transportation Needs:   .  Lack of Transportation (Medical):   Marland Kitchen Lack of Transportation (Non-Medical):   Physical Activity:   . Days of Exercise per Week:   . Minutes of Exercise per Session:   Stress:   . Feeling of Stress :   Social Connections:   . Frequency of Communication with Friends and Family:   . Frequency of Social Gatherings with Friends and Family:   . Attends Religious Services:   . Active Member of Clubs or Organizations:   . Attends Archivist Meetings:   Marland Kitchen Marital Status:      Family History: The patient's family history includes Autism spectrum disorder in her son; CAD in her mother; COPD in her daughter; Cancer in her father; Crohn's disease in her daughter; Heart attack in her mother; Heart disease in her father; High Cholesterol in her brother, brother, daughter, and father; Hypertension in her brother, brother, and mother. ROS:   Please see the history of present illness.    All 14 point review of systems negative except as described per history of present illness  EKGs/Labs/Other Studies Reviewed:      Recent Labs: 03/24/2020: BUN 18; Creatinine, Ser 0.98; NT-Pro BNP 755; Potassium 4.0; Sodium 142  Recent Lipid Panel No results found for: CHOL, TRIG, HDL, CHOLHDL, VLDL, LDLCALC, LDLDIRECT  Physical Exam:    VS:  BP (!) 148/82 (BP Location: Left Arm, Patient Position: Sitting, Cuff Size: Normal)   Pulse 81   Ht  5\' 2"  (1.575 m)   Wt 183 lb (83 kg)   SpO2 99%   BMI 33.47 kg/m     Wt Readings from Last 3 Encounters:  04/21/20 183 lb (83 kg)  03/16/20 184 lb 3.2 oz (83.6 kg)  02/04/20 186 lb 6.4 oz (84.6 kg)     GEN:  Well nourished, well developed in no acute distress HEENT: Normal NECK: No JVD; No carotid bruits LYMPHATICS: No lymphadenopathy CARDIAC: Irregular irregular but controlled., no murmurs, no rubs, no gallops RESPIRATORY:  Clear to auscultation without rales, wheezing or rhonchi  ABDOMEN: Soft, non-tender, non-distended MUSCULOSKELETAL:  No edema; No deformity  SKIN: Warm and dry LOWER EXTREMITIES: no swelling NEUROLOGIC:  Alert and oriented x 3 PSYCHIATRIC:  Normal affect   ASSESSMENT:    1. Persistent atrial fibrillation ([place])   2. Mild aortic stenosis   3. Nonrheumatic mitral valve regurgitation   4. Late effect of cerebrovascular accident (CVA)   5. S/P cryoablation of arrhythmia    PLAN:    In order of problems listed above:  1. Persistent atrial fibrillation, anticoagulated and I stressed importance of taking anticoagulation on the regular basis.  Plan as outlined above which means if she does not improve with higher diuresis then reconsider cardioversion. 2. Mild aortic stenosis not critical we will continue monitoring. 3. Mild mitral valve regurgitation stable. 4. Late effect of CVA doing well from that point review. 5. Status post cryoablation of atrial fibrillation 10 years ago.  She does have recurrences of atrial fibrillation. 6. Weakness fatigue and shortness of breath we will try to switch some medications, ask her to reduce dose of lisinopril to only 2.5 mg daily, also will switch to regiment of Tenormin to 50 mg twice daily from 100 mg once a day, I will increase dose of furosemide to 120 mg daily, she will have Chem-7 done on Monday.   Medication Adjustments/Labs and Tests Ordered: Current medicines are reviewed at length with the patient today.   Concerns regarding medicines are outlined above.  No orders of the defined types were placed in this encounter.  Medication changes: No orders of the defined types were placed in this encounter.   Signed, Park Liter, MD, [facility] 04/21/2020 12:01 PM    [place]

## 2020-04-25 ENCOUNTER — Other Ambulatory Visit: Payer: Self-pay | Admitting: Emergency Medicine

## 2020-04-25 DIAGNOSIS — I350 Nonrheumatic aortic (valve) stenosis: Secondary | ICD-10-CM

## 2020-04-25 DIAGNOSIS — Z8679 Personal history of other diseases of the circulatory system: Secondary | ICD-10-CM

## 2020-04-25 DIAGNOSIS — I340 Nonrheumatic mitral (valve) insufficiency: Secondary | ICD-10-CM

## 2020-04-25 DIAGNOSIS — I4819 Other persistent atrial fibrillation: Secondary | ICD-10-CM

## 2020-04-25 DIAGNOSIS — I6930 Unspecified sequelae of cerebral infarction: Secondary | ICD-10-CM

## 2020-04-25 LAB — BASIC METABOLIC PANEL
BUN/Creatinine Ratio: 18 (ref 12–28)
BUN: 19 mg/dL (ref 8–27)
CO2: 29 mmol/L (ref 20–29)
Calcium: 10.1 mg/dL (ref 8.7–10.3)
Chloride: 101 mmol/L (ref 96–106)
Creatinine, Ser: 1.07 mg/dL — ABNORMAL HIGH (ref 0.57–1.00)
GFR calc Af Amer: 59 mL/min/{1.73_m2} — ABNORMAL LOW (ref >59)
GFR calc non Af Amer: 51 mL/min/{1.73_m2} — ABNORMAL LOW (ref >59)
Glucose: 101 mg/dL — ABNORMAL HIGH (ref 65–99)
Potassium: 4.6 mmol/L (ref 3.5–5.2)
Sodium: 143 mmol/L (ref 134–144)

## 2020-04-29 ENCOUNTER — Ambulatory Visit: Payer: Medicare Other | Admitting: Cardiology

## 2020-05-11 DIAGNOSIS — Z8489 Family history of other specified conditions: Secondary | ICD-10-CM | POA: Insufficient documentation

## 2020-05-11 DIAGNOSIS — I351 Nonrheumatic aortic (valve) insufficiency: Secondary | ICD-10-CM | POA: Insufficient documentation

## 2020-05-11 DIAGNOSIS — M1990 Unspecified osteoarthritis, unspecified site: Secondary | ICD-10-CM | POA: Insufficient documentation

## 2020-05-11 DIAGNOSIS — E669 Obesity, unspecified: Secondary | ICD-10-CM | POA: Insufficient documentation

## 2020-05-11 DIAGNOSIS — I4892 Unspecified atrial flutter: Secondary | ICD-10-CM | POA: Insufficient documentation

## 2020-05-11 DIAGNOSIS — D649 Anemia, unspecified: Secondary | ICD-10-CM | POA: Insufficient documentation

## 2020-05-11 DIAGNOSIS — Z889 Allergy status to unspecified drugs, medicaments and biological substances status: Secondary | ICD-10-CM | POA: Insufficient documentation

## 2020-05-11 DIAGNOSIS — N390 Urinary tract infection, site not specified: Secondary | ICD-10-CM | POA: Insufficient documentation

## 2020-05-11 DIAGNOSIS — I340 Nonrheumatic mitral (valve) insufficiency: Secondary | ICD-10-CM | POA: Insufficient documentation

## 2020-05-11 DIAGNOSIS — I10 Essential (primary) hypertension: Secondary | ICD-10-CM | POA: Insufficient documentation

## 2020-05-11 DIAGNOSIS — E785 Hyperlipidemia, unspecified: Secondary | ICD-10-CM | POA: Insufficient documentation

## 2020-05-11 DIAGNOSIS — E079 Disorder of thyroid, unspecified: Secondary | ICD-10-CM | POA: Insufficient documentation

## 2020-05-12 ENCOUNTER — Encounter: Payer: Self-pay | Admitting: Cardiology

## 2020-05-12 ENCOUNTER — Ambulatory Visit (INDEPENDENT_AMBULATORY_CARE_PROVIDER_SITE_OTHER): Payer: Medicare Other | Admitting: Cardiology

## 2020-05-12 ENCOUNTER — Other Ambulatory Visit: Payer: Self-pay

## 2020-05-12 VITALS — BP 116/64 | HR 88 | Ht 62.0 in | Wt 181.0 lb

## 2020-05-12 DIAGNOSIS — I10 Essential (primary) hypertension: Secondary | ICD-10-CM | POA: Diagnosis not present

## 2020-05-12 DIAGNOSIS — I340 Nonrheumatic mitral (valve) insufficiency: Secondary | ICD-10-CM

## 2020-05-12 DIAGNOSIS — Z9889 Other specified postprocedural states: Secondary | ICD-10-CM

## 2020-05-12 DIAGNOSIS — I4819 Other persistent atrial fibrillation: Secondary | ICD-10-CM

## 2020-05-12 DIAGNOSIS — Z8679 Personal history of other diseases of the circulatory system: Secondary | ICD-10-CM

## 2020-05-12 NOTE — Progress Notes (Signed)
Cardiology Office Note:    Date:  05/12/2020   ID:  Kim Rice, DOB 22-Mar-1946, MRN [id number]  PCP:  Raina Mina., MD  Cardiologist:  Jenne Campus, MD    Referring MD: Raina Mina., MD   No chief complaint on file. I am still weak and tired  History of Present Illness:    Kim Rice is a 74 y.o. female past medical history significant for paroxysmal atrial fibrillation, status post atrial fibrillation ablation done many years ago, after that she maintain sinus rhythm and she was taken off anticoagulation, however she presented to the hospital in December with new TIA luckily she did not have any permanent neurological sequelae of it.  She was also find to have mild aortic stenosis as well as moderate mitral regurgitation.  She comes today 2 months for follow-up.  She complained of being weak tired exhausted we try to manage this problem with diuretics hoping that she will improve improve her symptomatology by in spite of all measures she still complaining of being weak tired and exhausted in spite of quite aggressive diuresis.  We talked today about options for this situation option being cardioverting her to sinus rhythm versus continuation of medical therapy.  She is tired of this already and she prefers cardioversion.  Past Medical History:  Diagnosis Date  . Abnormal myocardial perfusion study 10/18/2017  . Anemia    after hysterectomy at age 80  . Anxiety   . Aortic atherosclerosis ([place]) 09/15/2019  . Aortic valve regurgitation, acquired    01/18/14 TEE ([facility]): mild AI, no AS  . Arthritis    osteoarthritis  . Asthma   . Atrial fibrillation and flutter ([place])   . Chronic right-sided low back pain without sciatica 02/29/2016  . Chronic right-sided thoracic back pain 02/29/2016  . Class 1 obesity due to excess calories with serious comorbidity and body mass index (BMI) of 33.0 to 33.9 in adult 01/17/2016  . Degenerative cervical disc 06/06/2018  . Essential hypertension  01/17/2016  . Family history of adverse reaction to anesthesia    dad said during eye surgery he felt everything  . Frequent UTI    hx of - none for a year (as of 01/05/15)  . GERD (gastroesophageal reflux disease)   . H/O seasonal allergies   . High risk medication use 01/17/2016  . History of blood transfusion 09/14/2016  . History of Graves' disease 01/17/2016  . Hyperlipidemia   . Hypertension   . Hypothyroidism   . Late effect of cerebrovascular accident (CVA) 11/02/2019  . Lipoma    just under right breast  . Major depressive disorder 01/17/2016  . Mild aortic stenosis 10/18/2017  . Mitral regurgitation moderate by echocardiogram from 2021 01/17/2016  . Mitral valve regurgitation    Trace MR/TR by 01/18/14 TEE ([facility])  . Mixed hyperlipidemia 01/17/2016  . OA (osteoarthritis) 01/17/2016  . Obesity (BMI 30-39.9)   . Osteoarthritis of left knee 01/18/2015  . PAF (paroxysmal atrial fibrillation) ([place]) 01/17/2016   Formatting of this note might be different from the original. Ablation 2015.  TIA 08/2019.  Now on eliquis.  . Persistent atrial fibrillation ([place]) 09/30/2019  . Postmenopausal 01/17/2016  . Prediabetes   . Primary hyperparathyroidism ([place]) 01/17/2016   Formatting of this note might be different from the original. Negative w/u in 2015 Abnormal 2021.  . S/P cryoablation of arrhythmia 10/18/2017  . Status post ablation of atrial fibrillation 09/30/2019  . 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  Thyroid disease    was hyperthyroid, had radiation "Graves Disease"  . TIA (transient ischemic attack) 09/15/2019  . Tinea corporis 01/17/2016    Past Surgical History:  Procedure Laterality Date  . APPENDECTOMY  1980   during exploratory surgery for endometriosis  . ATRIAL FIBRILLATION ABLATION  01/30/14  . BACK SURGERY  1981   lumbar disc removed  . CARDIOVERSION  06/25/13  . COLONOSCOPY    . KNEE ARTHROSCOPY W/ MENISCAL REPAIR Left 2014  . ROTATOR CUFF REPAIR Left   . throat polyps  removed  1979  . TONSILLECTOMY    . TOTAL KNEE ARTHROPLASTY Left 01/18/2015   Procedure: LEFT TOTAL KNEE ARTHROPLASTY;  Surgeon: Mcarthur Rossetti, MD;  Location: [facility];  Service: Orthopedics;  Laterality: Left;  Marland Kitchen VAGINAL HYSTERECTOMY  1976    Current Medications: Current Meds  Medication Sig  . apixaban (ELIQUIS) 5 MG TABS tablet Take 5 mg by mouth 2 (two) times daily.  Marland Kitchen aspirin 81 MG tablet Take 81 mg by mouth daily.  Marland Kitchen atenolol (TENORMIN) 100 MG tablet Take 50 mg by mouth in the morning and at bedtime.  Marland Kitchen atorvastatin (LIPITOR) 80 MG tablet Take 80 mg by mouth daily.   Marland Kitchen EPINEPHrine 0.3 mg/0.3 mL IJ SOAJ injection Inject into the muscle.  . escitalopram (LEXAPRO) 10 MG tablet Take 10 mg by mouth at bedtime.   Marland Kitchen esomeprazole (NEXIUM) 40 MG capsule Take 40 mg by mouth every evening.   . fluticasone (FLONASE) 50 MCG/ACT nasal spray Place 1 spray into both nostrils as needed.   . Fluticasone-Salmeterol (ADVAIR) 250-50 MCG/DOSE AEPB Inhale 1 puff into the lungs 2 (two) times daily as needed (wheezing).  . furosemide (LASIX) 80 MG tablet Take 1.5 tablets (120 mg total) by mouth daily.  Marland Kitchen levothyroxine (SYNTHROID) 75 MCG tablet Take by mouth daily before breakfast.   . lisinopril (ZESTRIL) 10 MG tablet Take 10 mg by mouth daily. TAKE 0.5 TABLET (5 mg) DAILY  . potassium chloride (KLOR-CON) 10 MEQ tablet Take 1 tablet (10 mEq total) by mouth daily.     Allergies:   Codeine and Latex   Social History   Socioeconomic History  . Marital status: Widowed    Spouse name: Not on file  . Number of children: Not on file  . Years of education: Not on file  . Highest education level: Not on file  Occupational History  . Not on file  Tobacco Use  . Smoking status: Former Smoker    Quit date: 01/04/1985    Years since quitting: 35.3  . Smokeless tobacco: Never Used  Vaping Use  . Vaping Use: Never used  Substance and Sexual Activity  . Alcohol use: Yes    Comment: occasional  . Drug  use: No  . Sexual activity: Not on file  Other Topics Concern  . Not on file  Social History Narrative  . Not on file   Social Determinants of Health   Financial Resource Strain:   . Difficulty of Paying Living Expenses: Not on file  Food Insecurity:   . Worried About Charity fundraiser in the Last Year: Not on file  . Ran Out of Food in the Last Year: Not on file  Transportation Needs:   . Lack of Transportation (Medical): Not on file  . Lack of Transportation (Non-Medical): Not on file  Physical Activity:   . Days of Exercise per Week: Not on file  . Minutes of Exercise per Session: Not on  file  Stress:   . Feeling of Stress : Not on file  Social Connections:   . Frequency of Communication with Friends and Family: Not on file  . Frequency of Social Gatherings with Friends and Family: Not on file  . Attends Religious Services: Not on file  . Active Member of Clubs or Organizations: Not on file  . Attends Archivist Meetings: Not on file  . Marital Status: Not on file     Family History: The patient's family history includes Autism spectrum disorder in her son; CAD in her mother; COPD in her daughter; Cancer in her father; Crohn's disease in her daughter; Heart attack in her mother; Heart disease in her father; High Cholesterol in her brother, brother, daughter, and father; Hypertension in her brother, brother, and mother. ROS:   Please see the history of present illness.    All 14 point review of systems negative except as described per history of present illness  EKGs/Labs/Other Studies Reviewed:      Recent Labs: 03/24/2020: NT-Pro BNP 755 04/25/2020: BUN 19; Creatinine, Ser 1.07; Potassium 4.6; Sodium 143  Recent Lipid Panel No results found for: CHOL, TRIG, HDL, CHOLHDL, VLDL, LDLCALC, LDLDIRECT  Physical Exam:    VS:  BP 116/64   Pulse 88   Ht 5\' 2"  (1.575 m)   Wt 181 lb (82.1 kg)   SpO2 98%   BMI 33.11 kg/m     Wt Readings from Last 3 Encounters:   05/12/20 181 lb (82.1 kg)  04/21/20 183 lb (83 kg)  03/16/20 184 lb 3.2 oz (83.6 kg)     GEN:  Well nourished, well developed in no acute distress HEENT: Normal NECK: No JVD; No carotid bruits LYMPHATICS: No lymphadenopathy CARDIAC: Irregular irregular, no murmurs, no rubs, no gallops RESPIRATORY:  Clear to auscultation without rales, wheezing or rhonchi  ABDOMEN: Soft, non-tender, non-distended MUSCULOSKELETAL:  No edema; No deformity  SKIN: Warm and dry LOWER EXTREMITIES: no swelling NEUROLOGIC:  Alert and oriented x 3 PSYCHIATRIC:  Normal affect   ASSESSMENT:    1. Persistent atrial fibrillation ([place])   2. Nonrheumatic mitral valve regurgitation   3. Essential hypertension   4. S/P cryoablation of arrhythmia    PLAN:    In order of problems listed above:  1. Persistent atrial fibrillation.  She is anticoagulant with Eliquis 5 mg.  Twice daily continue present management will make arrangements for cardioversion.  I explained the risk benefits as well as alternatives which she is ready to proceed. 2. Nonrheumatic mitral valve regurgitation.  Stable from that point review last echocardiogram showed only mild. 3. Essential hypertension her blood pressure is well controlled.  We will continue present management. 4. History of cryoablation of atrial fibrillation done many years ago.  Now she is back to atrial fibrillation.  We will try to simply cardiovert her to sinus rhythm without any antiarrhythmic therapy.  If with failed and antiarrhythmic therapy will be initiated and then we talk about potentially doing another ablation if she he will improve symptoms difficulty from symptomatology point review after conversion.   Medication Adjustments/Labs and Tests Ordered: Current medicines are reviewed at length with the patient today.  Concerns regarding medicines are outlined above.  No orders of the defined types were placed in this encounter.  Medication changes: No orders of  the defined types were placed in this encounter.   Signed, Park Liter, MD, [facility] 05/12/2020 3:16 PM    [place]

## 2020-05-12 NOTE — Patient Instructions (Addendum)
Medication Instructions:  Your physician recommends that you continue on your current medications as directed. Please refer to the Current Medication list given to you today.  *If you need a refill on your cardiac medications before your next appointment, please call your pharmacy*   Lab Work: Your physician recommends that you return for lab work 1 week before cardioversion   You will need to have a covid test within 7 days of your procedure. You can go to urgent care for this. Please bring a copy of your negative test to our office so we can fax it to the hospital.   If you have labs (blood work) drawn today and your tests are completely normal, you will receive your results only by: Marland Kitchen MyChart Message (if you have MyChart) OR . A paper copy in the mail If you have any lab test that is abnormal or we need to change your treatment, we will call you to review the results.   Testing/Procedures: Your physician has recommended that you have a Cardioversion (DCCV). Electrical Cardioversion uses a jolt of electricity to your heart either through paddles or wired patches attached to your chest. This is a controlled, usually prescheduled, procedure. Defibrillation is done under light anesthesia in the hospital, and you usually go home the day of the procedure. This is done to get your heart back into a normal rhythm. You are not awake for the procedure. Please see the instruction sheet given to you today.     Follow-Up: At [facility], you and your health needs are our priority.  As part of our continuing mission to provide you with exceptional heart care, we have created designated Provider Care Teams.  These Care Teams include your primary Cardiologist (physician) and Advanced Practice Providers (APPs -  Physician Assistants and Nurse Practitioners) who all work together to provide you with the care you need, when you need it.  We recommend signing up for the patient portal called "MyChart".   Sign up information is provided on this After Visit Summary.  MyChart is used to connect with patients for Virtual Visits (Telemedicine).  Patients are able to view lab/test results, encounter notes, upcoming appointments, etc.  Non-urgent messages can be sent to your provider as well.   To learn more about what you can do with MyChart, go to [email or web address].    Your next appointment:   2 month(s)  The format for your next appointment:   In Person  Provider:   Jenne Campus, MD   Other Instructions You will be scheduled for a cardioversion at [facility] [address] [place],  [address]   DIET: Nothing to eat or drink after midnight except a sip of water with medications (see medication instructions below)  Medication Instructions: Hold : Lasix the morning of your procedure   Continue your anticoagulant: Eliquis  You will need to continue your anticoagulant after your procedure until you are told by your Provider that it is safe to stop   You must have a responsible person to drive you home and stay in the waiting area during your procedure. Failure to do so could result in cancellation.  Bring your insurance cards.  *Special Note: Every effort is made to have your procedure done on time. Occasionally there are emergencies that occur at the hospital that may cause delays. Please be patient if a delay does occur.

## 2020-05-13 ENCOUNTER — Telehealth: Payer: Self-pay | Admitting: Cardiology

## 2020-05-13 NOTE — Telephone Encounter (Signed)
Patient states she is to have a cardioversion scheduled per her conversation with Dr. Agustin Cree yesterday. Please advise.

## 2020-05-13 NOTE — Telephone Encounter (Signed)
Called patient. Explained to her that we need the cardioversion precert to be approved before we can schedule. Patient aware once we get the approval we will be in touch to get the cardioversion scheduled.

## 2020-05-17 ENCOUNTER — Other Ambulatory Visit (HOSPITAL_COMMUNITY): Payer: Medicare Other

## 2020-05-17 NOTE — Telephone Encounter (Signed)
Patient calling back.   °

## 2020-05-17 NOTE — Telephone Encounter (Signed)
New message:     Nurse from [facility] calling this patient please call back. [phone]

## 2020-05-17 NOTE — Telephone Encounter (Signed)
Patient called back and wanted me to schedule [facility]. Patient scheduled and aware.

## 2020-05-17 NOTE — Telephone Encounter (Signed)
Tried to call covid testing site back no answer. Patient already went somewhere else and got tested.

## 2020-05-17 NOTE — Telephone Encounter (Signed)
Patient states she went by urgent care for her COVID test. However, she was denied. She states they informed her that she has to see her doctor prior to having the COVID screening with them. She is requesting recommendations for other COVID testing sites. Please advise.

## 2020-05-17 NOTE — Telephone Encounter (Signed)
Jessica with [facility] COVID testing site is requesting to make [facility] aware that she has to turn the patient away. Janett Billow states they are unable to test patients over 4 days prior to their procedures and patient's procedure is scheduled for 05/24/20. She states the [facility] COVID testing site is also unable to test patient's who are having procedures at [place] would like a call back to discuss at [phone], however, she states Hayley may speak with any nurse who is available to clarify.

## 2020-05-17 NOTE — Telephone Encounter (Signed)
Called patient offered her a appointment at [facility] testing site she doesn't want to schedule that yet she is going to try to find somewhere local. If not she will call back to schedule at [facility].

## 2020-05-17 NOTE — Telephone Encounter (Signed)
Spoke to patient informed her that her appointment for her cardioversion is next Tuesday 05/24/20 at 730 am, patint aware she needs to come here for labs tomorrow and that she needs to have covid test and bring [place] results before Friday she verbally understood. No further questions.

## 2020-05-18 LAB — CBC
Hematocrit: 47.1 % — ABNORMAL HIGH (ref 34.0–46.6)
Hemoglobin: 15.7 g/dL (ref 11.1–15.9)
MCH: 31.3 pg (ref 26.6–33.0)
MCHC: 33.3 g/dL (ref 31.5–35.7)
MCV: 94 fL (ref 79–97)
Platelets: 302 10*3/uL (ref 150–450)
RBC: 5.01 x10E6/uL (ref 3.77–5.28)
RDW: 13.8 % (ref 11.7–15.4)
WBC: 12.7 10*3/uL — ABNORMAL HIGH (ref 3.4–10.8)

## 2020-05-18 LAB — BASIC METABOLIC PANEL
BUN/Creatinine Ratio: 22 (ref 12–28)
BUN: 26 mg/dL (ref 8–27)
CO2: 26 mmol/L (ref 20–29)
Calcium: 10.3 mg/dL (ref 8.7–10.3)
Chloride: 103 mmol/L (ref 96–106)
Creatinine, Ser: 1.16 mg/dL — ABNORMAL HIGH (ref 0.57–1.00)
GFR calc Af Amer: 54 mL/min/{1.73_m2} — ABNORMAL LOW (ref >59)
GFR calc non Af Amer: 46 mL/min/{1.73_m2} — ABNORMAL LOW (ref >59)
Glucose: 101 mg/dL — ABNORMAL HIGH (ref 65–99)
Potassium: 4.9 mmol/L (ref 3.5–5.2)
Sodium: 145 mmol/L — ABNORMAL HIGH (ref 134–144)

## 2020-05-24 ENCOUNTER — Telehealth: Payer: Self-pay | Admitting: Cardiology

## 2020-05-24 DIAGNOSIS — I4891 Unspecified atrial fibrillation: Secondary | ICD-10-CM | POA: Diagnosis not present

## 2020-05-24 NOTE — Telephone Encounter (Signed)
Kim Rice is calling stating she was advised to make a 1 week f/u with Dr. Agustin Cree after her cardioversion today, but he does not have anything available before 10/07. She is requesting she be worked in. Please advise.

## 2020-05-24 NOTE — Telephone Encounter (Signed)
Lets wait and see if I will have some time available after changes in the schedule.  I still do not know what, changes has been made apparently there was some free of his time available for me.  If not she can come within the next 2 to 3 weeks for EKG and any MD can see her for it

## 2020-05-25 NOTE — Telephone Encounter (Signed)
Called patient. Scheduled her for an appointment with Dr. Harriet Masson.

## 2020-05-26 ENCOUNTER — Telehealth: Payer: Self-pay | Admitting: Cardiology

## 2020-05-26 NOTE — Telephone Encounter (Signed)
Called patient informed her of Dr. Wendy Poet message below. She verbally understood no further questions.

## 2020-05-26 NOTE — Telephone Encounter (Signed)
How not sure exactly what to make out of this.  I would just simply keep watching situation, if it happened again she may need to go to the emergency room, make sure she takes anticoagulation

## 2020-05-26 NOTE — Telephone Encounter (Signed)
Patient states last night as she was lying in bed on her right side, she suddenly woke up and realized her hand was touching her toe and she was clinching her fist. She states soon after this episode she was able to relax her fist, but she developed a bad headache. Patient would like to ensure that this episode was not a stroke. Around 1 AM she took her BP (156/91) and HR (57) and went back to sleep. When she woke up she took her BP (156/83) and HR (61) again. Please return call to further discuss.

## 2020-06-06 ENCOUNTER — Encounter: Payer: Self-pay | Admitting: Cardiology

## 2020-06-06 ENCOUNTER — Other Ambulatory Visit: Payer: Self-pay

## 2020-06-06 ENCOUNTER — Ambulatory Visit (INDEPENDENT_AMBULATORY_CARE_PROVIDER_SITE_OTHER): Payer: Medicare Other | Admitting: Cardiology

## 2020-06-06 VITALS — BP 120/70 | HR 68 | Ht 62.0 in | Wt 184.0 lb

## 2020-06-06 DIAGNOSIS — R072 Precordial pain: Secondary | ICD-10-CM

## 2020-06-06 DIAGNOSIS — G459 Transient cerebral ischemic attack, unspecified: Secondary | ICD-10-CM

## 2020-06-06 DIAGNOSIS — E669 Obesity, unspecified: Secondary | ICD-10-CM

## 2020-06-06 DIAGNOSIS — I480 Paroxysmal atrial fibrillation: Secondary | ICD-10-CM

## 2020-06-06 DIAGNOSIS — E782 Mixed hyperlipidemia: Secondary | ICD-10-CM

## 2020-06-06 DIAGNOSIS — R0789 Other chest pain: Secondary | ICD-10-CM

## 2020-06-06 DIAGNOSIS — I10 Essential (primary) hypertension: Secondary | ICD-10-CM

## 2020-06-06 DIAGNOSIS — I340 Nonrheumatic mitral (valve) insufficiency: Secondary | ICD-10-CM

## 2020-06-06 DIAGNOSIS — I350 Nonrheumatic aortic (valve) stenosis: Secondary | ICD-10-CM

## 2020-06-06 DIAGNOSIS — R7303 Prediabetes: Secondary | ICD-10-CM

## 2020-06-06 MED ORDER — NITROGLYCERIN 0.4 MG SL SUBL: 0.4000 mg | SUBLINGUAL_TABLET | SUBLINGUAL | 6 refills | Status: DC | PRN

## 2020-06-06 NOTE — Patient Instructions (Signed)
Medication Instructions:  Your physician has recommended you make the following change in your medication:   Take Nitroglycerin as needed for chest pain.  *If you need a refill on your cardiac medications before your next appointment, please call your pharmacy*   Lab Work: None ordered If you have labs (blood work) drawn today and your tests are completely normal, you will receive your results only by: Marland Kitchen MyChart Message (if you have MyChart) OR . A paper copy in the mail If you have any lab test that is abnormal or we need to change your treatment, we will call you to review the results.   Testing/Procedures: Your physician has requested that you have a lexiscan myoview. For further information please visit [email or web address]. Please follow instruction sheet, as given.  The test will take approximately 3 to 4 hours to complete; you may bring reading material.  If someone comes with you to your appointment, they will need to remain in the main lobby due to limited space in the testing area.   How to prepare for your Myocardial Perfusion Test: . Do not eat or drink 3 hours prior to your test, except you may have water. . Do not consume products containing caffeine (regular or decaffeinated) 12 hours prior to your test. (ex: coffee, chocolate, sodas, tea). . Do bring a list of your current medications with you.  If not listed below, you may take your medications as normal. . Do wear comfortable clothes (no dresses or overalls) and walking shoes, tennis shoes preferred (No heels or open toe shoes are allowed). . Do NOT wear cologne, perfume, aftershave, or lotions (deodorant is allowed). . If these instructions are not followed, your test will have to be rescheduled.    Follow-Up: At [facility], you and your health needs are our priority.  As part of our continuing mission to provide you with exceptional heart care, we have created designated Provider Care Teams.  These Care Teams  include your primary Cardiologist (physician) and Advanced Practice Providers (APPs -  Physician Assistants and Nurse Practitioners) who all work together to provide you with the care you need, when you need it.  We recommend signing up for the patient portal called "MyChart".  Sign up information is provided on this After Visit Summary.  MyChart is used to connect with patients for Virtual Visits (Telemedicine).  Patients are able to view lab/test results, encounter notes, upcoming appointments, etc.  Non-urgent messages can be sent to your provider as well.   To learn more about what you can do with MyChart, go to [email or web address].    Your next appointment:   2 month(s)  The format for your next appointment:   In Person  Provider:   Jenne Campus, MD   Other Instructions Keep November appointment with Dr. Agustin Cree.   Cardiac Nuclear Scan A cardiac nuclear scan is a test that is done to check the flow of blood to your heart. It is done when you are resting and when you are exercising. The test looks for problems such as:  Not enough blood reaching a portion of the heart.  The heart muscle not working as it should. You may need this test if:  You have heart disease.  You have had lab results that are not normal.  You have had heart surgery or a balloon procedure to open up blocked arteries (angioplasty).  You have chest pain.  You have shortness of breath. In this test, a special dye (tracer) is put  into your bloodstream. The tracer will travel to your heart. A camera will then take pictures of your heart to see how the tracer moves through your heart. This test is usually done at a hospital and takes 2-4 hours. Tell a doctor about:  Any allergies you have.  All medicines you are taking, including vitamins, herbs, eye drops, creams, and over-the-counter medicines.  Any problems you or family members have had with anesthetic medicines.  Any blood disorders you  have.  Any surgeries you have had.  Any medical conditions you have.  Whether you are pregnant or may be pregnant. What are the risks? Generally, this is a safe test. However, problems may occur, such as:  Serious chest pain and heart attack. This is only a risk if the stress portion of the test is done.  Rapid heartbeat.  A feeling of warmth in your chest. This feeling usually does not last long.  Allergic reaction to the tracer. What happens before the test?  Ask your doctor about changing or stopping your normal medicines. This is important.  Follow instructions from your doctor about what you cannot eat or drink.  Remove your jewelry on the day of the test. What happens during the test?  An IV tube will be inserted into one of your veins.  Your doctor will give you a small amount of tracer through the IV tube.  You will wait for 20-40 minutes while the tracer moves through your bloodstream.  Your heart will be monitored with an electrocardiogram (ECG).  You will lie down on an exam table.  Pictures of your heart will be taken for about 15-20 minutes.  You may also have a stress test. For this test, one of these things may be done: ? You will be asked to exercise on a treadmill or a stationary bike. ? You will be given medicines that will make your heart work harder. This is done if you are unable to exercise.  When blood flow to your heart has peaked, a tracer will again be given through the IV tube.  After 20-40 minutes, you will get back on the exam table. More pictures will be taken of your heart.  Depending on the tracer that is used, more pictures may need to be taken 3-4 hours later.  Your IV tube will be removed when the test is over. The test may vary among doctors and hospitals. What happens after the test?  Ask your doctor: ? Whether you can return to your normal schedule, including diet, activities, and medicines. ? Whether you should drink more  fluids. This will help to remove the tracer from your body. Drink enough fluid to keep your pee (urine) pale yellow.  Ask your doctor, or the department that is doing the test: ? When will my results be ready? ? How will I get my results? Summary  A cardiac nuclear scan is a test that is done to check the flow of blood to your heart.  Tell your doctor whether you are pregnant or may be pregnant.  Before the test, ask your doctor about changing or stopping your normal medicines. This is important.  Ask your doctor whether you can return to your normal activities. You may be asked to drink more fluids. This information is not intended to replace advice given to you by your health care provider. Make sure you discuss any questions you have with your health care provider. Document Revised: 12/24/2018 Document Reviewed: 02/17/2018 Elsevier Patient Education  [place].  Nitroglycerin sublingual tablets What is this medicine? NITROGLYCERIN (nye troe GLI ser in) is a type of vasodilator. It relaxes blood vessels, increasing the blood and oxygen supply to your heart. This medicine is used to relieve chest pain caused by angina. It is also used to prevent chest pain before activities like climbing stairs, going outdoors in cold weather, or sexual activity. This medicine may be used for other purposes; ask your health care provider or pharmacist if you have questions. COMMON BRAND NAME(S): Nitroquick, Nitrostat, Nitrotab What should I tell my health care provider before I take this medicine? They need to know if you have any of these conditions:  anemia  head injury, recent stroke, or bleeding in the brain  liver disease  previous heart attack  an unusual or allergic reaction to nitroglycerin, other medicines, foods, dyes, or preservatives  pregnant or trying to get pregnant  breast-feeding How should I use this medicine? Take this medicine by mouth as needed. At the first sign  of an angina attack (chest pain or tightness) place one tablet under your tongue. You can also take this medicine 5 to 10 minutes before an event likely to produce chest pain. Follow the directions on the prescription label. Let the tablet dissolve under the tongue. Do not swallow whole. Replace the dose if you accidentally swallow it. It will help if your mouth is not dry. Saliva around the tablet will help it to dissolve more quickly. Do not eat or drink, smoke or chew tobacco while a tablet is dissolving. If you are not better within 5 minutes after taking ONE dose of nitroglycerin, call 9-1-1 immediately to seek emergency medical care. Do not take more than 3 nitroglycerin tablets over 15 minutes. If you take this medicine often to relieve symptoms of angina, your doctor or health care professional may provide you with different instructions to manage your symptoms. If symptoms do not go away after following these instructions, it is important to call 9-1-1 immediately. Do not take more than 3 nitroglycerin tablets over 15 minutes. Talk to your pediatrician regarding the use of this medicine in children. Special care may be needed. Overdosage: If you think you have taken too much of this medicine contact a poison control center or emergency room at once. NOTE: This medicine is only for you. Do not share this medicine with others. What if I miss a dose? This does not apply. This medicine is only used as needed. What may interact with this medicine? Do not take this medicine with any of the following medications:  certain migraine medicines like ergotamine and dihydroergotamine (DHE)  medicines used to treat erectile dysfunction like sildenafil, tadalafil, and vardenafil  riociguat This medicine may also interact with the following medications:  alteplase  aspirin  heparin  medicines for high blood pressure  medicines for mental depression  other medicines used to treat  angina  phenothiazines like chlorpromazine, mesoridazine, prochlorperazine, thioridazine This list may not describe all possible interactions. Give your health care provider a list of all the medicines, herbs, non-prescription drugs, or dietary supplements you use. Also tell them if you smoke, drink alcohol, or use illegal drugs. Some items may interact with your medicine. What should I watch for while using this medicine? Tell your doctor or health care professional if you feel your medicine is no longer working. Keep this medicine with you at all times. Sit or lie down when you take your medicine to prevent falling if you  feel dizzy or faint after using it. Try to remain calm. This will help you to feel better faster. If you feel dizzy, take several deep breaths and lie down with your feet propped up, or bend forward with your head resting between your knees. You may get drowsy or dizzy. Do not drive, use machinery, or do anything that needs mental alertness until you know how this drug affects you. Do not stand or sit up quickly, especially if you are an older patient. This reduces the risk of dizzy or fainting spells. Alcohol can make you more drowsy and dizzy. Avoid alcoholic drinks. Do not treat yourself for coughs, colds, or pain while you are taking this medicine without asking your doctor or health care professional for advice. Some ingredients may increase your blood pressure. What side effects may I notice from receiving this medicine? Side effects that you should report to your doctor or health care professional as soon as possible:  blurred vision  dry mouth  skin rash  sweating  the feeling of extreme pressure in the head  unusually weak or tired Side effects that usually do not require medical attention (report to your doctor or health care professional if they continue or are bothersome):  flushing of the face or neck  headache  irregular heartbeat,  palpitations  nausea, vomiting This list may not describe all possible side effects. Call your doctor for medical advice about side effects. You may report side effects to FDA at 1-800-FDA-1088. Where should I keep my medicine? Keep out of the reach of children. Store at room temperature between 20 and 25 degrees C (68 and 77 degrees F). Store in [employer]. Protect from light and moisture. Keep tightly closed. Throw away any unused medicine after the expiration date. NOTE: This sheet is a summary. It may not cover all possible information. If you have questions about this medicine, talk to your doctor, pharmacist, or health care provider.  2020 Elsevier/Gold Standard (2013-07-02 17:57:36)

## 2020-06-06 NOTE — Addendum Note (Signed)
Addended by: Truddie Hidden on: 06/06/2020 09:29 AM   Modules accepted: Orders

## 2020-06-06 NOTE — Progress Notes (Signed)
Cardiology Office Note:    Date:  06/06/2020   ID:  Kim Rice, DOB 1945/11/16, MRN [id number]  PCP:  Raina Mina., MD  Cardiologist:  No primary care provider on file.  Electrophysiologist:  None   Referring MD: Raina Mina., MD   Chief Complaint  Patient presents with  . Follow-up    History of Present Illness:    Kim Rice is a 74 y.o. female with a hx of paroxysmal atrial fibrillation status post ablation, currently on Eliquis atenolol, history of TIA, hypertension, hyperlipidemia, mild to moderate aortic stenosis and prediabetes presents today to be evaluated for chest pain.  Patient tells me that over the last couple weeks she has had intermittent left-sided chest pressure.  She notes that it comes off and on for a few seconds prior to resolution.  Nothing makes it better or worse.  Her most recent episode about 3 days ago.  Not experiencing this pain today.   Past Medical History:  Diagnosis Date  . Abnormal myocardial perfusion study 10/18/2017  . Anemia    after hysterectomy at age 62  . Anxiety   . Aortic atherosclerosis ([place]) 09/15/2019  . Aortic valve regurgitation, acquired    01/18/14 TEE ([facility]): mild AI, no AS  . Arthritis    osteoarthritis  . Asthma   . Atrial fibrillation and flutter ([place])   . Chronic right-sided low back pain without sciatica 02/29/2016  . Chronic right-sided thoracic back pain 02/29/2016  . Class 1 obesity due to excess calories with serious comorbidity and body mass index (BMI) of 33.0 to 33.9 in adult 01/17/2016  . Congestive heart failure (CHF) ([place]) 02/04/2020  . Degenerative cervical disc 06/06/2018  . Essential hypertension 01/17/2016  . Family history of adverse reaction to anesthesia    dad said during eye surgery he felt everything  . Frequent UTI    hx of - none for a year (as of 01/05/15)  . GERD (gastroesophageal reflux disease)   . H/O seasonal allergies   . High risk medication use 01/17/2016  . History of blood  transfusion 09/14/2016  . History of Graves' disease 01/17/2016  . Hyperlipidemia   . Hypertension   . Hypothyroidism   . Late effect of cerebrovascular accident (CVA) 11/02/2019  . Lipoma    just under right breast  . Major depressive disorder 01/17/2016  . Mild aortic stenosis 10/18/2017  . Mitral regurgitation moderate by echocardiogram from 2021 01/17/2016  . Mitral valve regurgitation    Trace MR/TR by 01/18/14 TEE ([facility])  . Mixed hyperlipidemia 01/17/2016  . OA (osteoarthritis) 01/17/2016  . Obesity (BMI 30-39.9)   . Osteoarthritis of left knee 01/18/2015  . PAF (paroxysmal atrial fibrillation) ([place]) 01/17/2016   Formatting of this note might be different from the original. Ablation 2015.  TIA 08/2019.  Now on eliquis.  . Persistent atrial fibrillation ([place]) 09/30/2019  . Postmenopausal 01/17/2016  . Prediabetes   . Primary hyperparathyroidism ([place]) 01/17/2016   Formatting of this note might be different from the original. Negative w/u in 2015 Abnormal 2021.  . S/P cryoablation of arrhythmia 10/18/2017  . Status post ablation of atrial fibrillation 09/30/2019  . 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 Thyroid disease    was hyperthyroid, had radiation "Graves Disease"  . TIA (transient ischemic attack) 09/15/2019  . Tinea corporis 01/17/2016    Past Surgical History:  Procedure Laterality Date  . APPENDECTOMY  1980   during exploratory surgery for endometriosis  .  ATRIAL FIBRILLATION ABLATION  01/30/14  . BACK SURGERY  1981   lumbar disc removed  . CARDIOVERSION  06/25/13  . COLONOSCOPY    . KNEE ARTHROSCOPY W/ MENISCAL REPAIR Left 2014  . ROTATOR CUFF REPAIR Left   . throat polyps removed  1979  . TONSILLECTOMY    . TOTAL KNEE ARTHROPLASTY Left 01/18/2015   Procedure: LEFT TOTAL KNEE ARTHROPLASTY;  Surgeon: Mcarthur Rossetti, MD;  Location: [facility];  Service: Orthopedics;  Laterality: Left;  Marland Kitchen VAGINAL HYSTERECTOMY  1976    Current Medications: Current Meds  Medication Sig  .  apixaban (ELIQUIS) 5 MG TABS tablet Take 5 mg by mouth 2 (two) times daily.  Marland Kitchen aspirin 81 MG tablet Take 81 mg by mouth daily.  Marland Kitchen atenolol (TENORMIN) 100 MG tablet Take 50 mg by mouth in the morning and at bedtime.  Marland Kitchen atorvastatin (LIPITOR) 80 MG tablet Take 80 mg by mouth daily.   Marland Kitchen EPINEPHrine 0.3 mg/0.3 mL IJ SOAJ injection Inject into the muscle.  . escitalopram (LEXAPRO) 10 MG tablet Take 10 mg by mouth at bedtime.   Marland Kitchen esomeprazole (NEXIUM) 40 MG capsule Take 40 mg by mouth every evening.   . fluticasone (FLONASE) 50 MCG/ACT nasal spray Place 1 spray into both nostrils as needed.   . Fluticasone-Salmeterol (ADVAIR) 250-50 MCG/DOSE AEPB Inhale 1 puff into the lungs 2 (two) times daily as needed (wheezing).  . furosemide (LASIX) 80 MG tablet Take 1.5 tablets (120 mg total) by mouth daily.  Marland Kitchen levothyroxine (SYNTHROID) 75 MCG tablet Take by mouth daily before breakfast.   . lisinopril (ZESTRIL) 10 MG tablet Take 10 mg by mouth daily. TAKE 0.5 TABLET (5 mg) DAILY  . potassium chloride (KLOR-CON) 10 MEQ tablet Take 1 tablet (10 mEq total) by mouth daily.     Allergies:   Codeine and Latex   Social History   Socioeconomic History  . Marital status: Widowed    Spouse name: Not on file  . Number of children: Not on file  . Years of education: Not on file  . Highest education level: Not on file  Occupational History  . Not on file  Tobacco Use  . Smoking status: Former Smoker    Quit date: 01/04/1985    Years since quitting: 35.4  . Smokeless tobacco: Never Used  Vaping Use  . Vaping Use: Never used  Substance and Sexual Activity  . Alcohol use: Yes    Comment: occasional  . Drug use: No  . Sexual activity: Not on file  Other Topics Concern  . Not on file  Social History Narrative  . Not on file   Social Determinants of Health   Financial Resource Strain:   . Difficulty of Paying Living Expenses: Not on file  Food Insecurity:   . Worried About Charity fundraiser in the  Last Year: Not on file  . Ran Out of Food in the Last Year: Not on file  Transportation Needs:   . Lack of Transportation (Medical): Not on file  . Lack of Transportation (Non-Medical): Not on file  Physical Activity:   . Days of Exercise per Week: Not on file  . Minutes of Exercise per Session: Not on file  Stress:   . Feeling of Stress : Not on file  Social Connections:   . Frequency of Communication with Friends and Family: Not on file  . Frequency of Social Gatherings with Friends and Family: Not on file  . Attends Religious Services:  Not on file  . Active Member of Clubs or Organizations: Not on file  . Attends Archivist Meetings: Not on file  . Marital Status: Not on file     Family History: The patient's family history includes Autism spectrum disorder in her son; CAD in her mother; COPD in her daughter; Cancer in her father; Crohn's disease in her daughter; Heart attack in her mother; Heart disease in her father; High Cholesterol in her brother, brother, daughter, and father; Hypertension in her brother, brother, and mother.  ROS:   Review of Systems  Constitution: Negative for decreased appetite, fever and weight gain.  HENT: Negative for congestion, ear discharge, hoarse voice and sore throat.   Eyes: Negative for discharge, redness, vision loss in right eye and visual halos.  Cardiovascular: Negative for chest pain, dyspnea on exertion, leg swelling, orthopnea and palpitations.  Respiratory: Negative for cough, hemoptysis, shortness of breath and snoring.   Endocrine: Negative for heat intolerance and polyphagia.  Hematologic/Lymphatic: Negative for bleeding problem. Does not bruise/bleed easily.  Skin: Negative for flushing, nail changes, rash and suspicious lesions.  Musculoskeletal: Negative for arthritis, joint pain, muscle cramps, myalgias, neck pain and stiffness.  Gastrointestinal: Negative for abdominal pain, bowel incontinence, diarrhea and excessive  appetite.  Genitourinary: Negative for decreased libido, genital sores and incomplete emptying.  Neurological: Negative for brief paralysis, focal weakness, headaches and loss of balance.  Psychiatric/Behavioral: Negative for altered mental status, depression and suicidal ideas.  Allergic/Immunologic: Negative for HIV exposure and persistent infections.    EKGs/Labs/Other Studies Reviewed:    The following studies were reviewed today:   EKG:  The ekg ordered today demonstrates sinus rhythm, heart rate 68 bpm.  Echo 03/2020 IMPRESSIONS  1. Left ventricular ejection fraction, by estimation, is 55 to 60%. The left ventricle has normal function. The left ventricle has no regional wall motion abnormalities. There is mild concentric left ventricular  hypertrophy. Left ventricular diastolic parameters are consistent with Grade II diastolic dysfunction  (pseudonormalization).  2. Right ventricular systolic function is normal. The right ventricular size is normal. There is normal pulmonary artery systolic pressure.  3. Left atrial size was mildly dilated.  4. The mitral valve is normal in structure. Mild mitral valve regurgitation. No evidence of mitral stenosis.  5. The aortic valve is thickened and mildly calcified. Aortic valve regurgitation is mild to moderate. Mild to moderate aortic valve stenosis. Aortic regurgitation PHT measures 430 msec. Aortic valve area, by VTI  measures 1.14 cm. Aortic valve mean gradient measures 14.0 mmHg. Aortic valve Vmax measures 2.55 m/s.  6. Tricuspid valve regurgitation is moderate.  7. The inferior vena cava is normal in size with greater than 50% respiratory variability, suggesting right atrial pressure of 3 mmHg.   Recent Labs: 03/24/2020: NT-Pro BNP 755 05/17/2020: BUN 26; Creatinine, Ser 1.16; Hemoglobin 15.7; Platelets 302; Potassium 4.9; Sodium 145  Recent Lipid Panel No results found for: CHOL, TRIG, HDL, CHOLHDL, VLDL, LDLCALC,  LDLDIRECT  Physical Exam:    VS:  BP 120/70 (BP Location: Right Arm, Patient Position: Sitting, Cuff Size: Normal)   Pulse 68   Ht 5\' 2"  (1.575 m)   Wt 184 lb (83.5 kg)   SpO2 97%   BMI 33.65 kg/m     Wt Readings from Last 3 Encounters:  06/06/20 184 lb (83.5 kg)  05/12/20 181 lb (82.1 kg)  04/21/20 183 lb (83 kg)     GEN: Well nourished, well developed in no acute distress HEENT: Normal NECK:  No JVD; No carotid bruits LYMPHATICS: No lymphadenopathy CARDIAC: S1S2 noted,RRR, no murmurs, rubs, gallops RESPIRATORY:  Clear to auscultation without rales, wheezing or rhonchi  ABDOMEN: Soft, non-tender, non-distended, +bowel sounds, no guarding. EXTREMITIES: No edema, No cyanosis, no clubbing MUSCULOSKELETAL:  No deformity  SKIN: Warm and dry NEUROLOGIC:  Alert and oriented x 3, non-focal PSYCHIATRIC:  Normal affect, good insight  ASSESSMENT:    1. Chest pressure   2. Essential hypertension   3. TIA (transient ischemic attack)   4. PAF (paroxysmal atrial fibrillation) ([place])   5. Nonrheumatic mitral valve regurgitation   6. Prediabetes   7. Obesity (BMI 30-39.9)   8. Mixed hyperlipidemia   9. Nonrheumatic aortic valve stenosis   10. Precordial pain    PLAN:     Her chest pressure is concerning given her risk factors for coronary artery disease.  We discussed testing for ischemic evaluation.  We discussed various testing will both agree pharmacologic nuclear stress test is appropriate at this time for this patient.  I have educated patient about this testing.  All of her questions has been answered.  She is agreeable to proceed.  In the meantime, sublingual nitroglycerin prescription was sent, its protocol and 911 protocol explained and the patient vocalized understanding questions were answered to the patient's satisfaction.  Her blood pressure deceptively in the office no changes will be made to her antihypertensive medication regimen.  The patient understands the need to  lose weight with diet and exercise. We have discussed specific strategies for this.  Continue patient on atorvastatin for hyperlipidemia. Heart rate is controlled on atenolol as well as stroke prevention with coagulation Eliquis 5 mg twice daily.  The patient is in agreement with the above plan. The patient left the office in stable condition.  The patient will follow up with Dr. Agustin Cree as scheduled.   Medication Adjustments/Labs and Tests Ordered: Current medicines are reviewed at length with the patient today.  Concerns regarding medicines are outlined above.  Orders Placed This Encounter  Procedures  . MYOCARDIAL PERFUSION IMAGING  . EKG 12-Lead   Meds ordered this encounter  Medications  . nitroGLYCERIN (NITROSTAT) 0.4 MG SL tablet    Sig: Place 1 tablet (0.4 mg total) under the tongue every 5 (five) minutes as needed.    Dispense:  25 tablet    Refill:  6    Patient Instructions  Medication Instructions:  Your physician has recommended you make the following change in your medication:   Take Nitroglycerin as needed for chest pain.  *If you need a refill on your cardiac medications before your next appointment, please call your pharmacy*   Lab Work: None ordered If you have labs (blood work) drawn today and your tests are completely normal, you will receive your results only by: Marland Kitchen MyChart Message (if you have MyChart) OR . A paper copy in the mail If you have any lab test that is abnormal or we need to change your treatment, we will call you to review the results.   Testing/Procedures: Your physician has requested that you have a lexiscan myoview. For further information please visit [email or web address]. Please follow instruction sheet, as given.  The test will take approximately 3 to 4 hours to complete; you may bring reading material.  If someone comes with you to your appointment, they will need to remain in the main lobby due to limited space in the testing area.    How to prepare for your Myocardial Perfusion Test: . Do not  eat or drink 3 hours prior to your test, except you may have water. . Do not consume products containing caffeine (regular or decaffeinated) 12 hours prior to your test. (ex: coffee, chocolate, sodas, tea). . Do bring a list of your current medications with you.  If not listed below, you may take your medications as normal. . Do wear comfortable clothes (no dresses or overalls) and walking shoes, tennis shoes preferred (No heels or open toe shoes are allowed). . Do NOT wear cologne, perfume, aftershave, or lotions (deodorant is allowed). . If these instructions are not followed, your test will have to be rescheduled.    Follow-Up: At [facility], you and your health needs are our priority.  As part of our continuing mission to provide you with exceptional heart care, we have created designated Provider Care Teams.  These Care Teams include your primary Cardiologist (physician) and Advanced Practice Providers (APPs -  Physician Assistants and Nurse Practitioners) who all work together to provide you with the care you need, when you need it.  We recommend signing up for the patient portal called "MyChart".  Sign up information is provided on this After Visit Summary.  MyChart is used to connect with patients for Virtual Visits (Telemedicine).  Patients are able to view lab/test results, encounter notes, upcoming appointments, etc.  Non-urgent messages can be sent to your provider as well.   To learn more about what you can do with MyChart, go to [email or web address].    Your next appointment:   2 month(s)  The format for your next appointment:   In Person  Provider:   Jenne Campus, MD   Other Instructions Keep November appointment with Dr. Agustin Cree.   Cardiac Nuclear Scan A cardiac nuclear scan is a test that is done to check the flow of blood to your heart. It is done when you are resting and when you are  exercising. The test looks for problems such as:  Not enough blood reaching a portion of the heart.  The heart muscle not working as it should. You may need this test if:  You have heart disease.  You have had lab results that are not normal.  You have had heart surgery or a balloon procedure to open up blocked arteries (angioplasty).  You have chest pain.  You have shortness of breath. In this test, a special dye (tracer) is put into your bloodstream. The tracer will travel to your heart. A camera will then take pictures of your heart to see how the tracer moves through your heart. This test is usually done at a hospital and takes 2-4 hours. Tell a doctor about:  Any allergies you have.  All medicines you are taking, including vitamins, herbs, eye drops, creams, and over-the-counter medicines.  Any problems you or family members have had with anesthetic medicines.  Any blood disorders you have.  Any surgeries you have had.  Any medical conditions you have.  Whether you are pregnant or may be pregnant. What are the risks? Generally, this is a safe test. However, problems may occur, such as:  Serious chest pain and heart attack. This is only a risk if the stress portion of the test is done.  Rapid heartbeat.  A feeling of warmth in your chest. This feeling usually does not last long.  Allergic reaction to the tracer. What happens before the test?  Ask your doctor about changing or stopping your normal medicines. This is important.  Follow instructions from your doctor  about what you cannot eat or drink.  Remove your jewelry on the day of the test. What happens during the test?  An IV tube will be inserted into one of your veins.  Your doctor will give you a small amount of tracer through the IV tube.  You will wait for 20-40 minutes while the tracer moves through your bloodstream.  Your heart will be monitored with an electrocardiogram (ECG).  You will lie  down on an exam table.  Pictures of your heart will be taken for about 15-20 minutes.  You may also have a stress test. For this test, one of these things may be done: ? You will be asked to exercise on a treadmill or a stationary bike. ? You will be given medicines that will make your heart work harder. This is done if you are unable to exercise.  When blood flow to your heart has peaked, a tracer will again be given through the IV tube.  After 20-40 minutes, you will get back on the exam table. More pictures will be taken of your heart.  Depending on the tracer that is used, more pictures may need to be taken 3-4 hours later.  Your IV tube will be removed when the test is over. The test may vary among doctors and hospitals. What happens after the test?  Ask your doctor: ? Whether you can return to your normal schedule, including diet, activities, and medicines. ? Whether you should drink more fluids. This will help to remove the tracer from your body. Drink enough fluid to keep your pee (urine) pale yellow.  Ask your doctor, or the department that is doing the test: ? When will my results be ready? ? How will I get my results? Summary  A cardiac nuclear scan is a test that is done to check the flow of blood to your heart.  Tell your doctor whether you are pregnant or may be pregnant.  Before the test, ask your doctor about changing or stopping your normal medicines. This is important.  Ask your doctor whether you can return to your normal activities. You may be asked to drink more fluids. This information is not intended to replace advice given to you by your health care provider. Make sure you discuss any questions you have with your health care provider. Document Revised: 12/24/2018 Document Reviewed: 02/17/2018 Elsevier Patient Education  [place].  Nitroglycerin sublingual tablets What is this medicine? NITROGLYCERIN (nye troe GLI ser in) is a type of  vasodilator. It relaxes blood vessels, increasing the blood and oxygen supply to your heart. This medicine is used to relieve chest pain caused by angina. It is also used to prevent chest pain before activities like climbing stairs, going outdoors in cold weather, or sexual activity. This medicine may be used for other purposes; ask your health care provider or pharmacist if you have questions. COMMON BRAND NAME(S): Nitroquick, Nitrostat, Nitrotab What should I tell my health care provider before I take this medicine? They need to know if you have any of these conditions:  anemia  head injury, recent stroke, or bleeding in the brain  liver disease  previous heart attack  an unusual or allergic reaction to nitroglycerin, other medicines, foods, dyes, or preservatives  pregnant or trying to get pregnant  breast-feeding How should I use this medicine? Take this medicine by mouth as needed. At the first sign of an angina attack (chest pain or tightness) place one tablet under your  tongue. You can also take this medicine 5 to 10 minutes before an event likely to produce chest pain. Follow the directions on the prescription label. Let the tablet dissolve under the tongue. Do not swallow whole. Replace the dose if you accidentally swallow it. It will help if your mouth is not dry. Saliva around the tablet will help it to dissolve more quickly. Do not eat or drink, smoke or chew tobacco while a tablet is dissolving. If you are not better within 5 minutes after taking ONE dose of nitroglycerin, call 9-1-1 immediately to seek emergency medical care. Do not take more than 3 nitroglycerin tablets over 15 minutes. If you take this medicine often to relieve symptoms of angina, your doctor or health care professional may provide you with different instructions to manage your symptoms. If symptoms do not go away after following these instructions, it is important to call 9-1-1 immediately. Do not take more than  3 nitroglycerin tablets over 15 minutes. Talk to your pediatrician regarding the use of this medicine in children. Special care may be needed. Overdosage: If you think you have taken too much of this medicine contact a poison control center or emergency room at once. NOTE: This medicine is only for you. Do not share this medicine with others. What if I miss a dose? This does not apply. This medicine is only used as needed. What may interact with this medicine? Do not take this medicine with any of the following medications:  certain migraine medicines like ergotamine and dihydroergotamine (DHE)  medicines used to treat erectile dysfunction like sildenafil, tadalafil, and vardenafil  riociguat This medicine may also interact with the following medications:  alteplase  aspirin  heparin  medicines for high blood pressure  medicines for mental depression  other medicines used to treat angina  phenothiazines like chlorpromazine, mesoridazine, prochlorperazine, thioridazine This list may not describe all possible interactions. Give your health care provider a list of all the medicines, herbs, non-prescription drugs, or dietary supplements you use. Also tell them if you smoke, drink alcohol, or use illegal drugs. Some items may interact with your medicine. What should I watch for while using this medicine? Tell your doctor or health care professional if you feel your medicine is no longer working. Keep this medicine with you at all times. Sit or lie down when you take your medicine to prevent falling if you feel dizzy or faint after using it. Try to remain calm. This will help you to feel better faster. If you feel dizzy, take several deep breaths and lie down with your feet propped up, or bend forward with your head resting between your knees. You may get drowsy or dizzy. Do not drive, use machinery, or do anything that needs mental alertness until you know how this drug affects you. Do  not stand or sit up quickly, especially if you are an older patient. This reduces the risk of dizzy or fainting spells. Alcohol can make you more drowsy and dizzy. Avoid alcoholic drinks. Do not treat yourself for coughs, colds, or pain while you are taking this medicine without asking your doctor or health care professional for advice. Some ingredients may increase your blood pressure. What side effects may I notice from receiving this medicine? Side effects that you should report to your doctor or health care professional as soon as possible:  blurred vision  dry mouth  skin rash  sweating  the feeling of extreme pressure in the head  unusually weak or  tired Side effects that usually do not require medical attention (report to your doctor or health care professional if they continue or are bothersome):  flushing of the face or neck  headache  irregular heartbeat, palpitations  nausea, vomiting This list may not describe all possible side effects. Call your doctor for medical advice about side effects. You may report side effects to FDA at 1-800-FDA-1088. Where should I keep my medicine? Keep out of the reach of children. Store at room temperature between 20 and 25 degrees C (68 and 77 degrees F). Store in Chief of Staff. Protect from light and moisture. Keep tightly closed. Throw away any unused medicine after the expiration date. NOTE: This sheet is a summary. It may not cover all possible information. If you have questions about this medicine, talk to your doctor, pharmacist, or health care provider.  2020 Elsevier/Gold Standard (2013-07-02 17:57:36)     Adopting a Healthy Lifestyle.  Know what a healthy weight is for you (roughly BMI <25) and aim to maintain this   Aim for 7+ servings of fruits and vegetables daily   65-80+ fluid ounces of water or unsweet tea for healthy kidneys   Limit to max 1 drink of alcohol per day; avoid smoking/tobacco   Limit animal fats  in diet for cholesterol and heart health - choose grass fed whenever available   Avoid highly processed foods, and foods high in saturated/trans fats   Aim for low stress - take time to unwind and care for your mental health   Aim for 150 min of moderate intensity exercise weekly for heart health, and weights twice weekly for bone health   Aim for 7-9 hours of sleep daily   When it comes to diets, agreement about the perfect plan isnt easy to find, even among the experts. Experts at the [employer] developed an idea known as the Healthy Eating Plate. Just imagine a plate divided into logical, healthy portions.   The emphasis is on diet quality:   Load up on vegetables and fruits - one-half of your plate: Aim for color and variety, and remember that potatoes dont count.   Go for whole grains - one-quarter of your plate: Whole wheat, barley, wheat berries, quinoa, oats, brown rice, and foods made with them. If you want pasta, go with whole wheat pasta.   Protein power - one-quarter of your plate: Fish, chicken, beans, and nuts are all healthy, versatile protein sources. Limit red meat.   The diet, however, does go beyond the plate, offering a few other suggestions.   Use healthy plant oils, such as olive, canola, soy, corn, sunflower and peanut. Check the labels, and avoid partially hydrogenated oil, which have unhealthy trans fats.   If youre thirsty, drink water. Coffee and tea are good in moderation, but skip sugary drinks and limit milk and dairy products to one or two daily servings.   The type of carbohydrate in the diet is more important than the amount. Some sources of carbohydrates, such as vegetables, fruits, whole grains, and beans-are healthier than others.   Finally, stay active  Signed, Berniece Salines, DO  06/06/2020 9:28 AM    [facility]

## 2020-06-07 ENCOUNTER — Telehealth (HOSPITAL_COMMUNITY): Payer: Self-pay | Admitting: *Deleted

## 2020-06-07 NOTE — Telephone Encounter (Signed)
Patient given detailed instructions per Myocardial Perfusion Study Information Sheet for the test on 06/08/20 @ 1115. Patient notified to arrive 15 minutes early and that it is imperative to arrive on time for appointment to keep from having the test rescheduled.  If you need to cancel or reschedule your appointment, please call the office within 24 hours of your appointment. . Patient verbalized understanding. Kirstie Peri

## 2020-06-08 ENCOUNTER — Other Ambulatory Visit: Payer: Self-pay

## 2020-06-08 ENCOUNTER — Ambulatory Visit (INDEPENDENT_AMBULATORY_CARE_PROVIDER_SITE_OTHER): Payer: Medicare Other

## 2020-06-08 DIAGNOSIS — R0789 Other chest pain: Secondary | ICD-10-CM | POA: Diagnosis not present

## 2020-06-08 DIAGNOSIS — R072 Precordial pain: Secondary | ICD-10-CM | POA: Diagnosis not present

## 2020-06-08 LAB — MYOCARDIAL PERFUSION IMAGING
LV dias vol: 66 mL (ref 46–106)
LV sys vol: 20 mL
Peak HR: 87 {beats}/min
Rest HR: 57 {beats}/min
SDS: 0
SRS: 0
SSS: 0
TID: 1

## 2020-06-08 MED ORDER — REGADENOSON 0.4 MG/5ML IV SOLN
0.4000 mg | Freq: Once | INTRAVENOUS | Status: AC
  Administered 2020-06-08: 0.4 mg via INTRAVENOUS

## 2020-06-08 MED ORDER — TECHNETIUM TC 99M TETROFOSMIN IV KIT
31.0000 | PACK | Freq: Once | INTRAVENOUS | Status: AC | PRN
  Administered 2020-06-08: 31 via INTRAVENOUS

## 2020-06-08 MED ORDER — TECHNETIUM TC 99M TETROFOSMIN IV KIT
11.0000 | PACK | Freq: Once | INTRAVENOUS | Status: AC | PRN
  Administered 2020-06-08: 11 via INTRAVENOUS

## 2020-06-10 ENCOUNTER — Telehealth: Payer: Self-pay | Admitting: *Deleted

## 2020-06-10 NOTE — Telephone Encounter (Signed)
Left voicemail message for patient to call back for results.  

## 2020-06-10 NOTE — Telephone Encounter (Signed)
-----   Message from Berniece Salines, DO sent at 06/09/2020  9:41 AM EDT ----- Doristine Devoid news stress test normal.

## 2020-06-13 ENCOUNTER — Telehealth: Payer: Self-pay

## 2020-06-13 NOTE — Telephone Encounter (Signed)
Spoke with patient regarding results and recommendation.  Patient verbalizes understanding and is agreeable to plan of care. Advised patient to call back with any issues or concerns.  

## 2020-06-13 NOTE — Telephone Encounter (Signed)
-----   Message from Berniece Salines, DO sent at 06/09/2020  9:41 AM EDT ----- Doristine Devoid news stress test normal.

## 2020-07-27 ENCOUNTER — Other Ambulatory Visit: Payer: Self-pay

## 2020-07-27 ENCOUNTER — Encounter: Payer: Self-pay | Admitting: Cardiology

## 2020-07-27 ENCOUNTER — Ambulatory Visit (INDEPENDENT_AMBULATORY_CARE_PROVIDER_SITE_OTHER): Payer: Medicare Other | Admitting: Cardiology

## 2020-07-27 VITALS — BP 110/68 | HR 70 | Ht 62.0 in | Wt 185.0 lb

## 2020-07-27 DIAGNOSIS — Z8679 Personal history of other diseases of the circulatory system: Secondary | ICD-10-CM

## 2020-07-27 DIAGNOSIS — I10 Essential (primary) hypertension: Secondary | ICD-10-CM

## 2020-07-27 DIAGNOSIS — Z9889 Other specified postprocedural states: Secondary | ICD-10-CM | POA: Diagnosis not present

## 2020-07-27 DIAGNOSIS — I480 Paroxysmal atrial fibrillation: Secondary | ICD-10-CM

## 2020-07-27 DIAGNOSIS — Z8673 Personal history of transient ischemic attack (TIA), and cerebral infarction without residual deficits: Secondary | ICD-10-CM

## 2020-07-27 DIAGNOSIS — I350 Nonrheumatic aortic (valve) stenosis: Secondary | ICD-10-CM

## 2020-07-27 MED ORDER — EZETIMIBE 10 MG PO TABS: 10.0000 mg | ORAL_TABLET | Freq: Every day | ORAL | 1 refills | Status: DC

## 2020-07-27 NOTE — Progress Notes (Signed)
Cardiology Office Note:    Date:  07/27/2020   ID:  [name], DOB [birth year], MRN [id number]  PCP:  Raina Mina., MD  Cardiologist:  Jenne Campus, MD    Referring MD: Raina Mina., MD   Chief Complaint  Patient presents with  . Follow-up  I am doing fine but I am disappointed because I cannot lose weight  History of Present Illness:    Kim Rice is a 74 y.o. female with past medical history significant for paroxysmal atrial fibrillation, status post atrial fibrillation ablation, that was done many years ago, her anticoagulation has been withdrawn and she presented to our hospital in December with TIA and back to atrial fibrillation, likely she converted back to sinus rhythm.  Also recently she started complaining of having some atypical chest chest pain.  Pain not related to exercise lasting only for a brief period of time stress test was done which was negative.  She comes today to my office for follow-up overall doing well but she is disappointed with the fact that she cannot lose weight.  She said she tried to exercise some she does have stationary bike as well as treadmill at home and try to stay on it for 10 minutes.  She also tried to change her diet still no changes in her weight.  Past Medical History:  Diagnosis Date  . Abnormal myocardial perfusion study 10/18/2017  . Anemia    after hysterectomy at age 59  . Anxiety   . Aortic atherosclerosis ([place]) 09/15/2019  . Aortic valve regurgitation, acquired    01/18/14 TEE ([facility]): mild AI, no AS  . Arthritis    osteoarthritis  . Asthma   . Atrial fibrillation and flutter ([place])   . Chronic right-sided low back pain without sciatica 02/29/2016  . Chronic right-sided thoracic back pain 02/29/2016  . Class 1 obesity due to excess calories with serious comorbidity and body mass index (BMI) of 33.0 to 33.9 in adult 01/17/2016  . Congestive heart failure (CHF) ([place]) 02/04/2020  . Degenerative cervical disc 06/06/2018  .  Essential hypertension 01/17/2016  . Family history of adverse reaction to anesthesia    dad said during eye surgery he felt everything  . Frequent UTI    hx of - none for a year (as of 01/05/15)  . GERD (gastroesophageal reflux disease)   . H/O seasonal allergies   . High risk medication use 01/17/2016  . History of blood transfusion 09/14/2016  . History of Graves' disease 01/17/2016  . Hyperlipidemia   . Hypertension   . Hypothyroidism   . Late effect of cerebrovascular accident (CVA) 11/02/2019  . Lipoma    just under right breast  . Major depressive disorder 01/17/2016  . Mild aortic stenosis 10/18/2017  . Mitral regurgitation moderate by echocardiogram from 2021 01/17/2016  . Mitral valve regurgitation    Trace MR/TR by 01/18/14 TEE ([facility])  . Mixed hyperlipidemia 01/17/2016  . OA (osteoarthritis) 01/17/2016  . Obesity (BMI 30-39.9)   . Osteoarthritis of left knee 01/18/2015  . PAF (paroxysmal atrial fibrillation) ([place]) 01/17/2016   Formatting of this note might be different from the original. Ablation 2015.  TIA 08/2019.  Now on eliquis.  . Persistent atrial fibrillation ([place]) 09/30/2019  . Postmenopausal 01/17/2016  . Prediabetes   . Primary hyperparathyroidism ([place]) 01/17/2016   Formatting of this note might be different from the original. Negative w/u in 2015 Abnormal 2021.  . S/P cryoablation of arrhythmia 10/18/2017  . Status post  ablation of atrial fibrillation 09/30/2019  . 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 Thyroid disease    was hyperthyroid, had radiation "Graves Disease"  . TIA (transient ischemic attack) 09/15/2019  . Tinea corporis 01/17/2016    Past Surgical History:  Procedure Laterality Date  . APPENDECTOMY  1980   during exploratory surgery for endometriosis  . ATRIAL FIBRILLATION ABLATION  01/30/14  . BACK SURGERY  1981   lumbar disc removed  . CARDIOVERSION  06/25/13  . COLONOSCOPY    . KNEE ARTHROSCOPY W/ MENISCAL REPAIR Left 2014  . ROTATOR CUFF REPAIR Left     . throat polyps removed  1979  . TONSILLECTOMY    . TOTAL KNEE ARTHROPLASTY Left 01/18/2015   Procedure: LEFT TOTAL KNEE ARTHROPLASTY;  Surgeon: Mcarthur Rossetti, MD;  Location: [facility];  Service: Orthopedics;  Laterality: Left;  Marland Kitchen VAGINAL HYSTERECTOMY  1976    Current Medications: Current Meds  Medication Sig  . apixaban (ELIQUIS) 5 MG TABS tablet Take 5 mg by mouth 2 (two) times daily.  Marland Kitchen aspirin 81 MG tablet Take 81 mg by mouth daily.  Marland Kitchen atenolol (TENORMIN) 100 MG tablet Take 50 mg by mouth in the morning and at bedtime.  Marland Kitchen atorvastatin (LIPITOR) 80 MG tablet Take 80 mg by mouth daily.   Marland Kitchen EPINEPHrine 0.3 mg/0.3 mL IJ SOAJ injection Inject into the muscle.  . escitalopram (LEXAPRO) 10 MG tablet Take 10 mg by mouth at bedtime.   Marland Kitchen esomeprazole (NEXIUM) 40 MG capsule Take 40 mg by mouth every evening.   . fluticasone (FLONASE) 50 MCG/ACT nasal spray Place 1 spray into both nostrils as needed.   . Fluticasone-Salmeterol (ADVAIR) 250-50 MCG/DOSE AEPB Inhale 1 puff into the lungs 2 (two) times daily as needed (wheezing).  . furosemide (LASIX) 80 MG tablet Take 1.5 tablets (120 mg total) by mouth daily.  Marland Kitchen levothyroxine (SYNTHROID) 75 MCG tablet Take by mouth daily before breakfast.   . lisinopril (ZESTRIL) 10 MG tablet Take 10 mg by mouth daily. TAKE 0.5 TABLET (5 mg) DAILY  . nitroGLYCERIN (NITROSTAT) 0.4 MG SL tablet Place 1 tablet (0.4 mg total) under the tongue every 5 (five) minutes as needed.  . potassium chloride (KLOR-CON) 10 MEQ tablet Take 1 tablet (10 mEq total) by mouth daily.     Allergies:   Codeine and Latex   Social History   Socioeconomic History  . Marital status: Widowed    Spouse name: Not on file  . Number of children: Not on file  . Years of education: Not on file  . Highest education level: Not on file  Occupational History  . Not on file  Tobacco Use  . Smoking status: Former Smoker    Quit date: 01/04/1985    Years since quitting: 35.5  . Smokeless  tobacco: Never Used  Vaping Use  . Vaping Use: Never used  Substance and Sexual Activity  . Alcohol use: Yes    Comment: occasional  . Drug use: No  . Sexual activity: Not on file  Other Topics Concern  . Not on file  Social History Narrative  . Not on file   Social Determinants of Health   Financial Resource Strain:   . Difficulty of Paying Living Expenses: Not on file  Food Insecurity:   . Worried About Charity fundraiser in the Last Year: Not on file  . Ran Out of Food in the Last Year: Not on file  Transportation Needs:   .  Lack of Transportation (Medical): Not on file  . Lack of Transportation (Non-Medical): Not on file  Physical Activity:   . Days of Exercise per Week: Not on file  . Minutes of Exercise per Session: Not on file  Stress:   . Feeling of Stress : Not on file  Social Connections:   . Frequency of Communication with Friends and Family: Not on file  . Frequency of Social Gatherings with Friends and Family: Not on file  . Attends Religious Services: Not on file  . Active Member of Clubs or Organizations: Not on file  . Attends Archivist Meetings: Not on file  . Marital Status: Not on file     Family History: The patient's family history includes Autism spectrum disorder in her son; CAD in her mother; COPD in her daughter; Cancer in her father; Crohn's disease in her daughter; Heart attack in her mother; Heart disease in her father; High Cholesterol in her brother, brother, daughter, and father; Hypertension in her brother, brother, and mother. ROS:   Please see the history of present illness.    All 14 point review of systems negative except as described per history of present illness  EKGs/Labs/Other Studies Reviewed:      Recent Labs: 03/24/2020: NT-Pro BNP 755 05/17/2020: BUN 26; Creatinine, Ser 1.16; Hemoglobin 15.7; Platelets 302; Potassium 4.9; Sodium 145  Recent Lipid Panel No results found for: CHOL, TRIG, HDL, CHOLHDL, VLDL,  LDLCALC, LDLDIRECT  Physical Exam:    VS:  BP 110/68 (BP Location: Left Arm, Patient Position: Sitting, Cuff Size: Normal)   Pulse 70   Ht 5\' 2"  (1.575 m)   Wt 185 lb (83.9 kg)   SpO2 95%   BMI 33.84 kg/m     Wt Readings from Last 3 Encounters:  07/27/20 185 lb (83.9 kg)  06/08/20 184 lb (83.5 kg)  06/06/20 184 lb (83.5 kg)     GEN:  Well nourished, well developed in no acute distress HEENT: Normal NECK: No JVD; No carotid bruits LYMPHATICS: No lymphadenopathy CARDIAC: RRR, no murmurs, no rubs, no gallops RESPIRATORY:  Clear to auscultation without rales, wheezing or rhonchi  ABDOMEN: Soft, non-tender, non-distended MUSCULOSKELETAL:  No edema; No deformity  SKIN: Warm and dry LOWER EXTREMITIES: no swelling NEUROLOGIC:  Alert and oriented x 3 PSYCHIATRIC:  Normal affect   ASSESSMENT:    1. PAF (paroxysmal atrial fibrillation) ([place])   2. Mild aortic stenosis   3. Essential hypertension   4. S/P cryoablation of arrhythmia   5. History of transient ischemic attack (TIA)    PLAN:    In order of problems listed above:  1. Paroxysmal atrial fibrillation.  She is maintaining sinus rhythm, she is on anticoagulation which I will continue. 2. Mild aortic stenosis, will continue monitoring. 3. Essential hypertension her blood pressure well controlled today 110/68 we will continue present management. 4. History of TIA anticoagulated which I will continue. 5. Atypical chest pain.  We did analyze her stress test which showed no evidence of ischemia, pain is atypical and I doubt very much this is coronary artery.  The key is risk factors modifications we I doing that. 6. Dyslipidemia: I did review her lipid panel from a primary care physician done 3 months ago, LDL was 1 7. 07, HDL 56.  We will add Zetia to her medical regimen, will recheck cholesterol within next few months   Medication Adjustments/Labs and Tests Ordered: Current medicines are reviewed at length with the patient  today.  Concerns regarding medicines are outlined above.  No orders of the defined types were placed in this encounter.  Medication changes: No orders of the defined types were placed in this encounter.   Signed, Park Liter, MD, [facility] 07/27/2020 11:53 AM    [place]

## 2020-07-27 NOTE — Addendum Note (Signed)
Addended by: Senaida Ores on: 07/27/2020 12:09 PM   Modules accepted: Orders

## 2020-07-27 NOTE — Patient Instructions (Signed)
Medication Instructions:  Your physician has recommended you make the following change in your medication:   START: Zetia 10 mg daily   *If you need a refill on your cardiac medications before your next appointment, please call your pharmacy*   Lab Work: Your physician recommends that you return for lab work in 6 weeks: lipid  If you have labs (blood work) drawn today and your tests are completely normal, you will receive your results only by:  [place] (if you have MyChart) OR  A paper copy in the mail If you have any lab test that is abnormal or we need to change your treatment, we will call you to review the results.   Testing/Procedures: None.   Follow-Up: At [facility], you and your health needs are our priority.  As part of our continuing mission to provide you with exceptional heart care, we have created designated Provider Care Teams.  These Care Teams include your primary Cardiologist (physician) and Advanced Practice Providers (APPs -  Physician Assistants and Nurse Practitioners) who all work together to provide you with the care you need, when you need it.  We recommend signing up for the patient portal called "MyChart".  Sign up information is provided on this After Visit Summary.  MyChart is used to connect with patients for Virtual Visits (Telemedicine).  Patients are able to view lab/test results, encounter notes, upcoming appointments, etc.  Non-urgent messages can be sent to your provider as well.   To learn more about what you can do with MyChart, go to [email or web address].    Your next appointment:   6 month(s)  The format for your next appointment:   In Person  Provider:   Jenne Campus, MD   Other Instructions  Ezetimibe Tablets What is this medicine? EZETIMIBE (ez ET i mibe) blocks the absorption of cholesterol from the stomach. It can help lower blood cholesterol for patients who are at risk of getting heart disease or a stroke. It  is only for patients whose cholesterol level is not controlled by diet. This medicine may be used for other purposes; ask your health care provider or pharmacist if you have questions. COMMON BRAND NAME(S): Zetia What should I tell my health care provider before I take this medicine? They need to know if you have any of these conditions:  liver disease  an unusual or allergic reaction to ezetimibe, medicines, foods, dyes, or preservatives  pregnant or trying to get pregnant  breast-feeding How should I use this medicine? Take this medicine by mouth with a glass of water. Follow the directions on the prescription label. This medicine can be taken with or without food. Take your doses at regular intervals. Do not take your medicine more often than directed. Talk to your pediatrician regarding the use of this medicine in children. Special care may be needed. Overdosage: If you think you have taken too much of this medicine contact a poison control center or emergency room at once. NOTE: This medicine is only for you. Do not share this medicine with others. What if I miss a dose? If you miss a dose, take it as soon as you can. If it is almost time for your next dose, take only that dose. Do not take double or extra doses. What may interact with this medicine? Do not take this medicine with any of the following medications:  fenofibrate  gemfibrozil This medicine may also interact with the following medications:  antacids  cyclosporine  herbal medicines  like red yeast rice  other medicines to lower cholesterol or triglycerides This list may not describe all possible interactions. Give your health care provider a list of all the medicines, herbs, non-prescription drugs, or dietary supplements you use. Also tell them if you smoke, drink alcohol, or use illegal drugs. Some items may interact with your medicine. What should I watch for while using this medicine? Visit your doctor or  health care professional for regular checks on your progress. You will need to have your cholesterol levels checked. If you are also taking some other cholesterol medicines, you will also need to have tests to make sure your liver is working properly. Tell your doctor or health care professional if you get any unexplained muscle pain, tenderness, or weakness, especially if you also have a fever and tiredness. You need to follow a low-cholesterol, low-fat diet while you are taking this medicine. This will decrease your risk of getting heart and blood vessel disease. Exercising and avoiding alcohol and smoking can also help. Ask your doctor or dietician for advice. What side effects may I notice from receiving this medicine? Side effects that you should report to your doctor or health care professional as soon as possible:  allergic reactions like skin rash, itching or hives, swelling of the face, lips, or tongue  dark yellow or brown urine  unusually weak or tired  yellowing of the skin or eyes Side effects that usually do not require medical attention (report to your doctor or health care professional if they continue or are bothersome):  diarrhea  dizziness  headache  stomach upset or pain This list may not describe all possible side effects. Call your doctor for medical advice about side effects. You may report side effects to FDA at 1-800-FDA-1088. Where should I keep my medicine? Keep out of the reach of children. Store at room temperature between 15 and 30 degrees C (59 and 86 degrees F). Protect from moisture. Keep container tightly closed. Throw away any unused medicine after the expiration date. NOTE: This sheet is a summary. It may not cover all possible information. If you have questions about this medicine, talk to your doctor, pharmacist, or health care provider.  2020 Elsevier/Gold Standard (2012-03-10 15:39:09)

## 2020-09-01 ENCOUNTER — Telehealth: Payer: Self-pay | Admitting: Cardiology

## 2020-09-01 NOTE — Telephone Encounter (Signed)
"  ezetimibe (ZETIA) 10 MG - if bloodwork shows I need to keep with this med I need it sent to [place] mail meds"  Note left by pt, put in Dr. Marthann Schiller box  kbl

## 2020-09-02 ENCOUNTER — Telehealth: Payer: Self-pay

## 2020-09-02 LAB — LIPID PANEL
Chol/HDL Ratio: 2.6 ratio (ref 0.0–4.4)
Cholesterol, Total: 144 mg/dL (ref 100–199)
HDL: 56 mg/dL (ref >39)
LDL Chol Calc (NIH): 59 mg/dL (ref 0–99)
Triglycerides: 175 mg/dL — ABNORMAL HIGH (ref 0–149)
VLDL Cholesterol Cal: 29 mg/dL (ref 5–40)

## 2020-09-02 MED ORDER — EZETIMIBE 10 MG PO TABS: 10.0000 mg | ORAL_TABLET | Freq: Every day | ORAL | 3 refills | Status: AC

## 2020-09-02 NOTE — Telephone Encounter (Signed)
-----   Message from Park Liter, MD sent at 09/02/2020  8:27 AM EST ----- Cholesterol looks good, continue present management

## 2020-09-02 NOTE — Telephone Encounter (Signed)
Patient advised of test results and sent Ezetimide 10 to [facility]  Per patient request.

## 2020-09-02 NOTE — Telephone Encounter (Signed)
Med was sent in

## 2020-12-14 DIAGNOSIS — R29818 Other symptoms and signs involving the nervous system: Secondary | ICD-10-CM | POA: Insufficient documentation

## 2021-02-23 ENCOUNTER — Ambulatory Visit: Payer: Medicare Other | Admitting: Cardiology

## 2021-05-12 ENCOUNTER — Telehealth: Payer: Self-pay | Admitting: Cardiology

## 2021-05-12 MED ORDER — FUROSEMIDE 80 MG PO TABS: 120.0000 mg | ORAL_TABLET | Freq: Every day | ORAL | 0 refills | Status: DC

## 2021-05-12 NOTE — Telephone Encounter (Signed)
Refill of Furosemide 80 mg sent to [facility].

## 2021-05-12 NOTE — Telephone Encounter (Signed)
*  STAT* If patient is at the pharmacy, call can be transferred to refill team.   1. Which medications need to be refilled? (please list name of each medication and dose if known)  furosemide (LASIX) 80 MG tablet (Expired)  2. Which pharmacy/location (including street and city if local pharmacy) is medication to be sent to? [facility] - [place], [address]  3. Do they need a 30 day or 90 day supply?  90 day supply

## 2021-05-23 ENCOUNTER — Other Ambulatory Visit: Payer: Self-pay

## 2021-05-24 ENCOUNTER — Ambulatory Visit (INDEPENDENT_AMBULATORY_CARE_PROVIDER_SITE_OTHER): Payer: Medicare Other | Admitting: Cardiology

## 2021-05-24 ENCOUNTER — Other Ambulatory Visit: Payer: Self-pay

## 2021-05-24 ENCOUNTER — Encounter: Payer: Self-pay | Admitting: Cardiology

## 2021-05-24 VITALS — BP 110/72 | HR 70 | Ht 62.0 in | Wt 191.4 lb

## 2021-05-24 DIAGNOSIS — I350 Nonrheumatic aortic (valve) stenosis: Secondary | ICD-10-CM | POA: Diagnosis not present

## 2021-05-24 DIAGNOSIS — G459 Transient cerebral ischemic attack, unspecified: Secondary | ICD-10-CM

## 2021-05-24 DIAGNOSIS — I480 Paroxysmal atrial fibrillation: Secondary | ICD-10-CM

## 2021-05-24 DIAGNOSIS — I10 Essential (primary) hypertension: Secondary | ICD-10-CM

## 2021-05-24 DIAGNOSIS — E782 Mixed hyperlipidemia: Secondary | ICD-10-CM

## 2021-05-24 DIAGNOSIS — I340 Nonrheumatic mitral (valve) insufficiency: Secondary | ICD-10-CM | POA: Diagnosis not present

## 2021-05-24 DIAGNOSIS — Z8679 Personal history of other diseases of the circulatory system: Secondary | ICD-10-CM

## 2021-05-24 DIAGNOSIS — Z9889 Other specified postprocedural states: Secondary | ICD-10-CM

## 2021-05-24 NOTE — Patient Instructions (Signed)
Medication Instructions:  Your physician recommends that you continue on your current medications as directed. Please refer to the Current Medication list given to you today.   *If you need a refill on your cardiac medications before your next appointment, please call your pharmacy*   Lab Work: Your physician recommends that you return for lab work in: at your echo appointment if eadly morning as you need to fast. You need to have labs done when you are fasting.  You can come Monday through Friday 8:30 am to 12:00 pm and 1:15 to 4:30. You do not need to make an appointment as the order has already been placed. This is to check your lipids.  If you have labs (blood work) drawn today and your tests are completely normal, you will receive your results only by: [place] (if you have MyChart) OR A paper copy in the mail If you have any lab test that is abnormal or we need to change your treatment, we will call you to review the results.   Testing/Procedures: Your physician has requested that you have an echocardiogram. Echocardiography is a painless test that uses sound waves to create images of your heart. It provides your doctor with information about the size and shape of your heart and how well your heart's chambers and valves are working. This procedure takes approximately one hour. There are no restrictions for this procedure.    Follow-Up: At [facility], you and your health needs are our priority.  As part of our continuing mission to provide you with exceptional heart care, we have created designated Provider Care Teams.  These Care Teams include your primary Cardiologist (physician) and Advanced Practice Providers (APPs -  Physician Assistants and Nurse Practitioners) who all work together to provide you with the care you need, when you need it.  We recommend signing up for the patient portal called "MyChart".  Sign up information is provided on this After Visit Summary.   MyChart is used to connect with patients for Virtual Visits (Telemedicine).  Patients are able to view lab/test results, encounter notes, upcoming appointments, etc.  Non-urgent messages can be sent to your provider as well.   To learn more about what you can do with MyChart, go to [email or web address].    Your next appointment:   6 month(s)  The format for your next appointment:   In Person  Provider:   Jenne Campus, MD   Other Instructions Echocardiogram An echocardiogram is a test that uses sound waves (ultrasound) to produce images of the heart. Images from an echocardiogram can provide important information about: Heart size and shape. The size and thickness and movement of your heart's walls. Heart muscle function and strength. Heart valve function or if you have stenosis. Stenosis is when the heart valves are too narrow. If blood is flowing backward through the heart valves (regurgitation). A tumor or infectious growth around the heart valves. Areas of heart muscle that are not working well because of poor blood flow or injury from a heart attack. Aneurysm detection. An aneurysm is a weak or damaged part of an artery wall. The wall bulges out from the normal force of blood pumping through the body. Tell a health care provider about: Any allergies you have. All medicines you are taking, including vitamins, herbs, eye drops, creams, and over-the-counter medicines. Any blood disorders you have. Any surgeries you have had. Any medical conditions you have. Whether you are pregnant or may be pregnant. What are the risks?  Generally, this is a safe test. However, problems may occur, including an allergic reaction to dye (contrast) that may be used during the test. What happens before the test? No specific preparation is needed. You may eat and drink normally. What happens during the test? You will take off your clothes from the waist up and put on a hospital  gown. Electrodes or electrocardiogram (ECG)patches may be placed on your chest. The electrodes or patches are then connected to a device that monitors your heart rate and rhythm. You will lie down on a table for an ultrasound exam. A gel will be applied to your chest to help sound waves pass through your skin. A handheld device, called a transducer, will be pressed against your chest and moved over your heart. The transducer produces sound waves that travel to your heart and bounce back (or "echo" back) to the transducer. These sound waves will be captured in real-time and changed into images of your heart that can be viewed on a video monitor. The images will be recorded on a computer and reviewed by your health care provider. You may be asked to change positions or hold your breath for a short time. This makes it easier to get different views or better views of your heart. In some cases, you may receive contrast through an IV in one of your veins. This can improve the quality of the pictures from your heart. The procedure may vary among health care providers and hospitals.   What can I expect after the test? You may return to your normal, everyday life, including diet, activities, and medicines, unless your health care provider tells you not to do that. Follow these instructions at home: It is up to you to get the results of your test. Ask your health care provider, or the department that is doing the test, when your results will be ready. Keep all follow-up visits. This is important. Summary An echocardiogram is a test that uses sound waves (ultrasound) to produce images of the heart. Images from an echocardiogram can provide important information about the size and shape of your heart, heart muscle function, heart valve function, and other possible heart problems. You do not need to do anything to prepare before this test. You may eat and drink normally. After the echocardiogram is completed, you  may return to your normal, everyday life, unless your health care provider tells you not to do that. This information is not intended to replace advice given to you by your health care provider. Make sure you discuss any questions you have with your health care provider. Document Revised: 04/26/2020 Document Reviewed: 04/26/2020 Elsevier Patient Education  2021 [employer].

## 2021-05-24 NOTE — Progress Notes (Signed)
Cardiology Office Note:    Date:  05/24/2021   ID:  Kim Rice, DOB 06/30/1946, MRN [id number]  PCP:  Raina Mina., MD  Cardiologist:  Jenne Campus, MD    Referring MD: Raina Mina., MD   Chief Complaint  Patient presents with   Follow-up  I am doing better but that shingles  History of Present Illness:    Kim Rice is a 75 y.o. female with past medical history significant for paroxysmal atrial fibrillation, status post atrial fibrillation ablation many years ago after that her anticoagulation has been discontinued done in December 2021 she showed up in our hospital with episode of TIA.  She was back in atrial fibrillation she was converted to sinus rhythm spontaneously, she was put back on anticoagulation and she comes to [place] to follow-up.  Recently she ended up having shingles she suffered for about 2 months with this finding she is back to herself.  Previously she also got stress test which showed no evidence of ischemia.  She comes today to my office for follow-up overall she is doing well symptoms of shingles improving dramatically.  No cardiac complaints she only got momentarily sensation of her heart skipping I suspect what she described extrasystole.  Denies have any chest pain tightness squeezing pressure burning chest.  Past Medical History:  Diagnosis Date   Abnormal myocardial perfusion study 10/18/2017   Anemia    after hysterectomy at age 36   Anxiety    Aortic atherosclerosis ([place]) 09/15/2019   Aortic valve regurgitation, acquired    01/18/14 TEE ([facility]): mild AI, no AS   Arthritis    osteoarthritis   Asthma    Atrial fibrillation and flutter (HCC)    Chronic right-sided low back pain without sciatica 02/29/2016   Chronic right-sided thoracic back pain 02/29/2016   Class 1 obesity due to excess calories with serious comorbidity and body mass index (BMI) of 33.0 to 33.9 in adult 01/17/2016   Congestive heart failure (CHF) ([place]) 02/04/2020   Degenerative  cervical disc 06/06/2018   Essential hypertension 01/17/2016   Family history of adverse reaction to anesthesia    dad said during eye surgery he felt everything   Frequent UTI    hx of - none for a year (as of 01/05/15)   GERD (gastroesophageal reflux disease)    H/O seasonal allergies    High risk medication use 01/17/2016   History of blood transfusion 09/14/2016   History of Graves' disease 01/17/2016   Hyperlipidemia    Hypertension    Hypothyroidism    Late effect of cerebrovascular accident (CVA) 11/02/2019   Lipoma    just under right breast   Major depressive disorder 01/17/2016   Mild aortic stenosis 10/18/2017   Mitral regurgitation moderate by echocardiogram from 2021 01/17/2016   Mitral valve regurgitation    Trace MR/TR by 01/18/14 TEE ([facility])   Mixed hyperlipidemia 01/17/2016   OA (osteoarthritis) 01/17/2016   Obesity (BMI 30-39.9)    Osteoarthritis of left knee 01/18/2015   PAF (paroxysmal atrial fibrillation) ([place]) 01/17/2016   Formatting of this note might be different from the original. Ablation 2015.  TIA 08/2019.  Now on eliquis.   Persistent atrial fibrillation ([place]) 09/30/2019   Postmenopausal 01/17/2016   Prediabetes    Primary hyperparathyroidism ([place]) 01/17/2016   Formatting of this note might be different from the original. Negative w/u in 2015 Abnormal 2021.   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   Thyroid disease    was hyperthyroid, had radiation "Graves Disease"   TIA (transient ischemic attack) 09/15/2019   Tinea corporis 01/17/2016    Past Surgical History:  Procedure Laterality Date   APPENDECTOMY  1980   during exploratory surgery for endometriosis   ATRIAL FIBRILLATION ABLATION  01/30/14   BACK SURGERY  1981   lumbar disc removed   CARDIOVERSION  06/25/13   COLONOSCOPY     KNEE ARTHROSCOPY W/ MENISCAL REPAIR Left 2014   ROTATOR CUFF REPAIR Left    throat  polyps removed  [place] ARTHROPLASTY Left 01/18/2015   Procedure: LEFT TOTAL KNEE ARTHROPLASTY;  Surgeon: Mcarthur Rossetti, MD;  Location: [facility];  Service: Orthopedics;  Laterality: Left;   VAGINAL HYSTERECTOMY  1976    Current Medications: Current Meds  Medication Sig   albuterol (VENTOLIN HFA) 108 (90 Base) MCG/ACT inhaler Inhale 2 puffs into the lungs every 6 (six) hours as needed for wheezing or shortness of breath.   apixaban (ELIQUIS) 5 MG TABS tablet Take 5 mg by mouth 2 (two) times daily.   aspirin 81 MG tablet Take 81 mg by mouth daily.   atenolol (TENORMIN) 100 MG tablet Take 50 mg by mouth in the morning and at bedtime.   atorvastatin (LIPITOR) 80 MG tablet Take 80 mg by mouth daily.    EPINEPHrine 0.3 mg/0.3 mL IJ SOAJ injection Inject 0.3 mg into the muscle as needed for anaphylaxis.   escitalopram (LEXAPRO) 10 MG tablet Take 10 mg by mouth at bedtime.    esomeprazole (NEXIUM) 40 MG capsule Take 40 mg by mouth every evening.    ezetimibe (ZETIA) 10 MG tablet Take 1 tablet (10 mg total) by mouth daily.   fluticasone (FLONASE) 50 MCG/ACT nasal spray Place 1 spray into both nostrils as needed for allergies.   Fluticasone-Salmeterol (ADVAIR) 250-50 MCG/DOSE AEPB Inhale 1 puff into the lungs 2 (two) times daily as needed (wheezing).   furosemide (LASIX) 80 MG tablet Take 1.5 tablets (120 mg total) by mouth daily.   levothyroxine (SYNTHROID) 88 MCG tablet Take 88 mcg by mouth every morning.   lisinopril (ZESTRIL) 10 MG tablet Take 10 mg by mouth daily. TAKE 0.5 TABLET (5 mg) DAILY   nitroGLYCERIN (NITROSTAT) 0.4 MG SL tablet Place 1 tablet (0.4 mg total) under the tongue every 5 (five) minutes as needed. (Patient taking differently: Place 0.4 mg under the tongue every 5 (five) minutes as needed for chest pain.)   potassium chloride (KLOR-CON) 10 MEQ tablet Take 1 tablet (10 mEq total) by mouth daily.     Allergies:   Codeine and Latex   Social  History   Socioeconomic History   Marital status: Widowed    Spouse name: Not on file   Number of children: Not on file   Years of education: Not on file   Highest education level: Not on file  Occupational History   Not on file  Tobacco Use   Smoking status: Former    Types: Cigarettes    Quit date: 01/04/1985    Years since quitting: 36.4   Smokeless tobacco: Never  Vaping Use   Vaping Use: Never used  Substance and Sexual Activity   Alcohol use: Yes    Comment: occasional   Drug use: No   Sexual activity: Not on file  Other Topics Concern   Not on file  Social History Narrative  Not on file   Social Determinants of Health   Financial Resource Strain: Not on file  Food Insecurity: Not on file  Transportation Needs: Not on file  Physical Activity: Not on file  Stress: Not on file  Social Connections: Not on file     Family History: The patient's family history includes Autism spectrum disorder in her son; CAD in her mother; COPD in her daughter; Cancer in her father; Crohn's disease in her daughter; Heart attack in her mother; Heart disease in her father; High Cholesterol in her brother, brother, daughter, and father; Hypertension in her brother, brother, and mother. ROS:   Please see the history of present illness.    All 14 point review of systems negative except as described per history of present illness  EKGs/Labs/Other Studies Reviewed:      Recent Labs: No results found for requested labs within last 8760 hours.  Recent Lipid Panel    Component Value Date/Time   CHOL 144 09/01/2020 1137   TRIG 175 (H) 09/01/2020 1137   HDL 56 09/01/2020 1137   CHOLHDL 2.6 09/01/2020 1137   LDLCALC 59 09/01/2020 1137    Physical Exam:    VS:  BP 110/72 (BP Location: Left Arm, Patient Position: Sitting)   Pulse 70   Ht '5\' 2"'$  (1.575 m)   Wt 191 lb 6.4 oz (86.8 kg)   SpO2 95%   BMI 35.01 kg/m     Wt Readings from Last 3 Encounters:  05/24/21 191 lb 6.4 oz  (86.8 kg)  07/27/20 185 lb (83.9 kg)  06/08/20 184 lb (83.5 kg)     GEN:  Well nourished, well developed in no acute distress HEENT: Normal NECK: No JVD; No carotid bruits LYMPHATICS: No lymphadenopathy CARDIAC: RRR, systolic ejection murmur grade 2/6 best heard right upper portion of the sternum, no rubs, no gallops RESPIRATORY:  Clear to auscultation without rales, wheezing or rhonchi  ABDOMEN: Soft, non-tender, non-distended MUSCULOSKELETAL:  No edema; No deformity  SKIN: Warm and dry LOWER EXTREMITIES: no swelling NEUROLOGIC:  Alert and oriented x 3 PSYCHIATRIC:  Normal affect   ASSESSMENT:    1. Mild aortic stenosis   2. Essential hypertension   3. TIA (transient ischemic attack)   4. Nonrheumatic mitral valve regurgitation   5. Status post ablation of atrial fibrillation   6. PAF (paroxysmal atrial fibrillation) (HCC)    PLAN:    In order of problems listed above:  Mild aortic stenosis this time to recheck her aortic stenosis we will do echocardiogram especially since she complained of having shortness of breath which could be related to deconditioning related to the fact that she did not do much exercise she is suffering from shingles, Essential hypertension blood pressure well controlled continue present management. Dyslipidemia I did review her K PN I have dated from September 01, 2020 with LDL of 59 HDL 56 she is on statin which I will continue.  We will recheck her fasting lipid profile Status post atrial fibrillation ablation noted Paroxysmal atrial fibrillation she is anticoagulated which I will continue denies having any recent 3) of arrhythmia.   Medication Adjustments/Labs and Tests Ordered: Current medicines are reviewed at length with the patient today.  Concerns regarding medicines are outlined above.  No orders of the defined types were placed in this encounter.  Medication changes: No orders of the defined types were placed in this  encounter.   Signed, Park Liter, MD, [facility] 05/24/2021 1:54 PM    [facility]

## 2021-05-24 NOTE — Addendum Note (Signed)
Addended by: Truddie Hidden on: 05/24/2021 02:11 PM   Modules accepted: Orders

## 2021-06-07 ENCOUNTER — Ambulatory Visit (INDEPENDENT_AMBULATORY_CARE_PROVIDER_SITE_OTHER): Payer: Medicare Other

## 2021-06-07 ENCOUNTER — Other Ambulatory Visit: Payer: Self-pay

## 2021-06-07 DIAGNOSIS — I350 Nonrheumatic aortic (valve) stenosis: Secondary | ICD-10-CM | POA: Diagnosis not present

## 2021-06-07 LAB — LIPID PANEL
Chol/HDL Ratio: 3.1 ratio (ref 0.0–4.4)
Cholesterol, Total: 162 mg/dL (ref 100–199)
HDL: 53 mg/dL (ref >39)
LDL Chol Calc (NIH): 77 mg/dL (ref 0–99)
Triglycerides: 190 mg/dL — ABNORMAL HIGH (ref 0–149)
VLDL Cholesterol Cal: 32 mg/dL (ref 5–40)

## 2021-06-07 LAB — ECHOCARDIOGRAM COMPLETE
AR max vel: 1.15 cm2
AV Area VTI: 1.3 cm2
AV Area mean vel: 1.13 cm2
AV Mean grad: 14.5 mmHg
AV Peak grad: 24.4 mmHg
Ao pk vel: 2.47 m/s
Area-P 1/2: 3.33 cm2
Calc EF: 53.7 %
P 1/2 time: 459 msec
S' Lateral: 2.5 cm
Single Plane A2C EF: 58.4 %
Single Plane A4C EF: 49 %

## 2021-08-25 ENCOUNTER — Telehealth: Payer: Self-pay | Admitting: Cardiology

## 2021-08-25 MED ORDER — FUROSEMIDE 80 MG PO TABS: 120.0000 mg | ORAL_TABLET | Freq: Every day | ORAL | 3 refills | Status: AC

## 2021-08-25 NOTE — Telephone Encounter (Signed)
*  STAT* If patient is at the pharmacy, call can be transferred to refill team.   1. Which medications need to be refilled? (please list name of each medication and dose if known) furosemide (LASIX) 80 MG tablet  2. Which pharmacy/location (including street and city if local pharmacy) is medication to be sent to? [facility] - [place], [address]  3. Do they need a 30 day or 90 day supply? 90 day   Patient has 2 weeks left.

## 2021-08-25 NOTE — Telephone Encounter (Signed)
Refill sent in per request.  

## 2021-09-26 ENCOUNTER — Ambulatory Visit (INDEPENDENT_AMBULATORY_CARE_PROVIDER_SITE_OTHER): Payer: Medicare Other | Admitting: Cardiology

## 2021-09-26 ENCOUNTER — Other Ambulatory Visit: Payer: Self-pay

## 2021-09-26 ENCOUNTER — Encounter: Payer: Self-pay | Admitting: Cardiology

## 2021-09-26 VITALS — BP 142/70 | HR 61 | Ht 61.5 in | Wt 192.4 lb

## 2021-09-26 DIAGNOSIS — I350 Nonrheumatic aortic (valve) stenosis: Secondary | ICD-10-CM | POA: Diagnosis not present

## 2021-09-26 DIAGNOSIS — E782 Mixed hyperlipidemia: Secondary | ICD-10-CM

## 2021-09-26 DIAGNOSIS — I10 Essential (primary) hypertension: Secondary | ICD-10-CM

## 2021-09-26 DIAGNOSIS — I480 Paroxysmal atrial fibrillation: Secondary | ICD-10-CM | POA: Diagnosis not present

## 2021-09-26 DIAGNOSIS — Z8679 Personal history of other diseases of the circulatory system: Secondary | ICD-10-CM

## 2021-09-26 DIAGNOSIS — Z9889 Other specified postprocedural states: Secondary | ICD-10-CM | POA: Diagnosis not present

## 2021-09-26 NOTE — Progress Notes (Signed)
Cardiology Office Note:    Date:  09/26/2021   ID:  [name], DOB 09/14/1946, MRN [id number]  PCP:  Raina Mina., MD  Cardiologist:  Jenne Campus, MD    Referring MD: Raina Mina., MD   Chief Complaint  Patient presents with   Follow-up  Am doing fine  History of Present Illness:    Kim Rice is a 76 y.o. female  with past medical history significant for paroxysmal atrial fibrillation, status post atrial fibrillation ablation many years ago after that her anticoagulation has been discontinued done in December 2021 she showed up in our hospital with episode of TIA.  She was back in atrial fibrillation she was converted to sinus rhythm spontaneously, she was put back on anticoagulation and she comes to [place] to follow-up.  She comes today to my office for follow-up.  Overall she seems to be doing well.  She denies have any chest pain tightness squeezing pressure burning chest.  Past Medical History:  Diagnosis Date   Abnormal myocardial perfusion study 10/18/2017   Anemia    after hysterectomy at age 59   Anxiety    Aortic atherosclerosis ([place]) 09/15/2019   Aortic valve regurgitation, acquired    01/18/14 TEE ([facility]): mild AI, no AS   Arthritis    osteoarthritis   Asthma    Atrial fibrillation and flutter (HCC)    Chronic right-sided low back pain without sciatica 02/29/2016   Chronic right-sided thoracic back pain 02/29/2016   Class 1 obesity due to excess calories with serious comorbidity and body mass index (BMI) of 33.0 to 33.9 in adult 01/17/2016   Congestive heart failure (CHF) ([place]) 02/04/2020   Degenerative cervical disc 06/06/2018   Essential hypertension 01/17/2016   Family history of adverse reaction to anesthesia    dad said during eye surgery he felt everything   Frequent UTI    hx of - none for a year (as of 01/05/15)   GERD (gastroesophageal reflux disease)    H/O seasonal allergies    High risk medication use 01/17/2016   History of blood transfusion  09/14/2016   History of Graves' disease 01/17/2016   Hyperlipidemia    Hypertension    Hypothyroidism    Late effect of cerebrovascular accident (CVA) 11/02/2019   Lipoma    just under right breast   Major depressive disorder 01/17/2016   Mild aortic stenosis 10/18/2017   Mitral regurgitation moderate by echocardiogram from 2021 01/17/2016   Mitral valve regurgitation    Trace MR/TR by 01/18/14 TEE ([facility])   Mixed hyperlipidemia 01/17/2016   OA (osteoarthritis) 01/17/2016   Obesity (BMI 30-39.9)    Osteoarthritis of left knee 01/18/2015   PAF (paroxysmal atrial fibrillation) ([place]) 01/17/2016   Formatting of this note might be different from the original. Ablation 2015.  TIA 08/2019.  Now on eliquis.   Persistent atrial fibrillation ([place]) 09/30/2019   Postmenopausal 01/17/2016   Prediabetes    Primary hyperparathyroidism ([place]) 01/17/2016   Formatting of this note might be different from the original. Negative w/u in 2015 Abnormal 2021.   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   Thyroid disease    was hyperthyroid, had radiation "Graves Disease"   TIA (transient ischemic attack) 09/15/2019   Tinea corporis 01/17/2016    Past Surgical History:  Procedure Laterality Date   APPENDECTOMY  1980   during exploratory surgery for endometriosis  ATRIAL FIBRILLATION ABLATION  01/30/14   BACK SURGERY  1981   lumbar disc removed   CARDIOVERSION  06/25/13   COLONOSCOPY     KNEE ARTHROSCOPY W/ MENISCAL REPAIR Left 2014   ROTATOR CUFF REPAIR Left    throat polyps removed  1979   TONSILLECTOMY     TOTAL KNEE ARTHROPLASTY Left 01/18/2015   Procedure: LEFT TOTAL KNEE ARTHROPLASTY;  Surgeon: Mcarthur Rossetti, MD;  Location: [facility];  Service: Orthopedics;  Laterality: Left;   VAGINAL HYSTERECTOMY  1976    Current Medications: Current Meds  Medication Sig   albuterol (VENTOLIN HFA) 108 (90 Base) MCG/ACT  inhaler Inhale 2 puffs into the lungs every 6 (six) hours as needed for wheezing or shortness of breath.   apixaban (ELIQUIS) 5 MG TABS tablet Take 5 mg by mouth 2 (two) times daily.   aspirin 81 MG tablet Take 81 mg by mouth daily.   atenolol (TENORMIN) 100 MG tablet Take 50 mg by mouth in the morning and at bedtime.   atorvastatin (LIPITOR) 80 MG tablet Take 80 mg by mouth daily.    EPINEPHrine 0.3 mg/0.3 mL IJ SOAJ injection Inject 0.3 mg into the muscle as needed for anaphylaxis.   escitalopram (LEXAPRO) 10 MG tablet Take 10 mg by mouth at bedtime.    esomeprazole (NEXIUM) 40 MG capsule Take 40 mg by mouth every evening.    ezetimibe (ZETIA) 10 MG tablet Take 1 tablet (10 mg total) by mouth daily.   fluticasone (FLONASE) 50 MCG/ACT nasal spray Place 1 spray into both nostrils as needed for allergies.   Fluticasone-Salmeterol (ADVAIR) 250-50 MCG/DOSE AEPB Inhale 1 puff into the lungs 2 (two) times daily as needed (wheezing).   furosemide (LASIX) 80 MG tablet Take 1.5 tablets (120 mg total) by mouth daily.   gabapentin (NEURONTIN) 300 MG capsule Take 300 mg by mouth daily as needed (nerve pain).   levothyroxine (SYNTHROID) 88 MCG tablet Take 88 mcg by mouth every morning.   lisinopril (ZESTRIL) 10 MG tablet Take 10 mg by mouth daily. TAKE 0.5 TABLET (5 mg) DAILY   nitroGLYCERIN (NITROSTAT) 0.4 MG SL tablet Place 1 tablet (0.4 mg total) under the tongue every 5 (five) minutes as needed. (Patient taking differently: Place 0.4 mg under the tongue every 5 (five) minutes as needed for chest pain.)   potassium chloride (KLOR-CON) 10 MEQ tablet Take 1 tablet (10 mEq total) by mouth daily.     Allergies:   Codeine and Latex   Social History   Socioeconomic History   Marital status: Widowed    Spouse name: Not on file   Number of children: Not on file   Years of education: Not on file   Highest education level: Not on file  Occupational History   Not on file  Tobacco Use   Smoking status:  Former    Types: Cigarettes    Quit date: 01/04/1985    Years since quitting: 36.7   Smokeless tobacco: Never  Vaping Use   Vaping Use: Never used  Substance and Sexual Activity   Alcohol use: Yes    Comment: occasional   Drug use: No   Sexual activity: Not on file  Other Topics Concern   Not on file  Social History Narrative   Not on file   Social Determinants of Health   Financial Resource Strain: Not on file  Food Insecurity: Not on file  Transportation Needs: Not on file  Physical Activity: Not on file  Stress:  Not on file  Social Connections: Not on file     Family History: The patient's family history includes Autism spectrum disorder in her son; CAD in her mother; COPD in her daughter; Cancer in her father; Crohn's disease in her daughter; Heart attack in her mother; Heart disease in her father; High Cholesterol in her brother, brother, daughter, and father; Hypertension in her brother, brother, and mother. ROS:   Please see the history of present illness.    All 14 point review of systems negative except as described per history of present illness  EKGs/Labs/Other Studies Reviewed:      Recent Labs: No results found for requested labs within last 8760 hours.  Recent Lipid Panel    Component Value Date/Time   CHOL 162 06/07/2021 0804   TRIG 190 (H) 06/07/2021 0804   HDL 53 06/07/2021 0804   CHOLHDL 3.1 06/07/2021 0804   LDLCALC 77 06/07/2021 0804    Physical Exam:    VS:  BP (!) 142/70 (BP Location: Left Arm, Patient Position: Sitting)    Pulse 61    Ht 5' 1.5" (1.562 m)    Wt 192 lb 6.4 oz (87.3 kg)    SpO2 94%    BMI 35.76 kg/m     Wt Readings from Last 3 Encounters:  09/26/21 192 lb 6.4 oz (87.3 kg)  05/24/21 191 lb 6.4 oz (86.8 kg)  07/27/20 185 lb (83.9 kg)     GEN:  Well nourished, well developed in no acute distress HEENT: Normal NECK: No JVD; No carotid bruits LYMPHATICS: No lymphadenopathy CARDIAC: RRR, soft systolic ejection murmur grade  1/6 to 2/6 best heard right upper portion of sternum, no rubs, no gallops RESPIRATORY:  Clear to auscultation without rales, wheezing or rhonchi  ABDOMEN: Soft, non-tender, non-distended MUSCULOSKELETAL:  No edema; No deformity  SKIN: Warm and dry LOWER EXTREMITIES: no swelling NEUROLOGIC:  Alert and oriented x 3 PSYCHIATRIC:  Normal affect   ASSESSMENT:    1. Mild aortic stenosis   2. Essential hypertension   3. PAF (paroxysmal atrial fibrillation) (HCC)   4. Status post ablation of atrial fibrillation   5. Mixed hyperlipidemia    PLAN:    In order of problems listed above:  Mild aortic stenosis.  Echocardiogram reviewed.  No reason to intervene we will continue follow-up. Essential hypertension blood pressure seems to well controlled continue present medications. Paroxysmal atrial fibrillation maintaining sinus rhythm she is anticoagulated which I will continue. Dyslipidemia I did review K PN which show me LDL 77 HDL 53 this is from September of this year it is acceptable cholesterol profile she is on high intensity statin in form of Lipitor 80 as well as Zetia which I will continue   Medication Adjustments/Labs and Tests Ordered: Current medicines are reviewed at length with the patient today.  Concerns regarding medicines are outlined above.  Orders Placed This Encounter  Procedures   EKG 12-Lead   Medication changes: No orders of the defined types were placed in this encounter.   Signed, Park Liter, MD, [facility] 09/26/2021 12:02 PM    [place]

## 2021-09-26 NOTE — Patient Instructions (Signed)
Medication Instructions:  Your physician recommends that you continue on your current medications as directed. Please refer to the Current Medication list given to you today.  *If you need a refill on your cardiac medications before your next appointment, please call your pharmacy*   Lab Work: None ordered  If you have labs (blood work) drawn today and your tests are completely normal, you will receive your results only by: [place] (if you have MyChart) OR A paper copy in the mail If you have any lab test that is abnormal or we need to change your treatment, we will call you to review the results.   Testing/Procedures: None ordered   Follow-Up: At [facility], you and your health needs are our priority.  As part of our continuing mission to provide you with exceptional heart care, we have created designated Provider Care Teams.  These Care Teams include your primary Cardiologist (physician) and Advanced Practice Providers (APPs -  Physician Assistants and Nurse Practitioners) who all work together to provide you with the care you need, when you need it.  We recommend signing up for the patient portal called "MyChart".  Sign up information is provided on this After Visit Summary.  MyChart is used to connect with patients for Virtual Visits (Telemedicine).  Patients are able to view lab/test results, encounter notes, upcoming appointments, etc.  Non-urgent messages can be sent to your provider as well.   To learn more about what you can do with MyChart, go to [email or web address].    Your next appointment:   6 month(s)  The format for your next appointment:   In Person  Provider:   Jenne Campus, MD    Other Instructions

## 2021-11-14 ENCOUNTER — Ambulatory Visit (INDEPENDENT_AMBULATORY_CARE_PROVIDER_SITE_OTHER): Payer: Medicare Other

## 2021-11-14 ENCOUNTER — Encounter: Payer: Self-pay | Admitting: Orthopaedic Surgery

## 2021-11-14 ENCOUNTER — Ambulatory Visit (INDEPENDENT_AMBULATORY_CARE_PROVIDER_SITE_OTHER): Payer: Medicare Other | Admitting: Orthopaedic Surgery

## 2021-11-14 DIAGNOSIS — G8929 Other chronic pain: Secondary | ICD-10-CM

## 2021-11-14 DIAGNOSIS — M25511 Pain in right shoulder: Secondary | ICD-10-CM | POA: Diagnosis not present

## 2021-11-14 MED ORDER — LIDOCAINE HCL 1 % IJ SOLN
3.0000 mL | INTRAMUSCULAR | Status: AC | PRN
  Administered 2021-11-14: 3 mL

## 2021-11-14 MED ORDER — METHYLPREDNISOLONE ACETATE 40 MG/ML IJ SUSP
40.0000 mg | INTRAMUSCULAR | Status: AC | PRN
  Administered 2021-11-14: 40 mg via INTRA_ARTICULAR

## 2021-11-14 NOTE — Progress Notes (Signed)
Office Visit Note   Patient: Kim Rice           Date of Birth: June 27, 1946           MRN: [id number] Visit Date: 11/14/2021              Requested by: Raina Mina., MD [address],  [place] [id number] PCP: Raina Mina., MD   Assessment & Plan: Visit Diagnoses:  1. Chronic right shoulder pain     Plan: Offered her formal therapy she defers.  Therefore she is given handouts on shoulder exercises and Thera-Band.  Questions encouraged and answered by Dr. Ninfa Linden and myself. Follow-Up Instructions: Return if symptoms worsen or fail to improve.   Orders:  Orders Placed This Encounter  Procedures   Large Joint Inj   XR Shoulder Right   No orders of the defined types were placed in this encounter.     Procedures: Large Joint Inj: R subacromial bursa on 11/14/2021 9:34 AM Indications: pain Details: 22 G 1.5 in needle, posterior approach  Arthrogram: No  Medications: 3 mL lidocaine 1 %; 40 mg methylPREDNISolone acetate 40 MG/ML Outcome: tolerated well, no immediate complications Procedure, treatment alternatives, risks and benefits explained, specific risks discussed. Consent was given by the patient. Immediately prior to procedure a time out was called to verify the correct patient, procedure, equipment, support staff and site/side marked as required. Patient was prepped and draped in the usual sterile fashion.      Clinical Data: No additional findings.   Subjective: Chief Complaint  Patient presents with   Right Shoulder - Pain    HPI  Patient 75 year old female well-known to Dr. Trevor Mace service however its been several years since we last seen her.  She comes in today with new complaint of right shoulder pain.  States she has had some shoulder pain for years but shoulder pain became worse recently over this past weekend after lifting her 100 something pound dog.  She states that she had severe pain after coming home from her daughters and  lifted her dog otherwise no known injury.  She states that she had severe pain with any range of motion of the shoulder found that adduction of the arm was the only way she could get any comfort.  She notes decreased range of motion secondary to pain.  She denies any numbness tingling down the arm.  Describes it as sharp pain but nothing past the elbow.  Review of Systems  Constitutional:  Negative for chills and fever.  See HPI  Objective: Vital Signs: There were no vitals taken for this visit.  Physical Exam Constitutional:      Appearance: She is not ill-appearing or diaphoretic.  Pulmonary:     Effort: Pulmonary effort is normal.  Neurological:     Mental Status: She is alert and oriented to person, place, and time.  Psychiatric:        Mood and Affect: Mood normal.    Ortho Exam Bilateral shoulders full forward flexion actively.  Positive impingement sign on the right negative on the left.  5 out of 5 strength with external and internal rotation bilateral shoulders except for right shoulder external rotation 4 out of 5 strength.  Empty can test is negative bilaterally.  Liftoff test negative on the left positive on the right.  No tenderness throughout the biceps Evidence of ecchymosis involving the right biceps muscle belly.  Distal biceps tendon intact and nontender.  Specialty Comments:  No specialty comments available.  Imaging: XR Shoulder Right  Result Date: 11/14/2021 Right shoulder 3 views: No acute fracture.  Shoulder is well located.  Inferior osteophytes off the humeral head seen best on the AP view.  Slight narrowing of glenohumeral joint on the axillary view.  Otherwise normal bone density.  Downsloping acromion type II    PMFS History: Patient Active Problem List   Diagnosis Date Noted   Suspected sleep apnea 12/14/2020   Thyroid disease    Obesity (BMI 30-39.9)    Mitral valve regurgitation    Hypertension    Hyperlipidemia    H/O seasonal allergies     Frequent UTI    Family history of adverse reaction to anesthesia    Atrial fibrillation and flutter (HCC)    Arthritis    Aortic valve regurgitation, acquired    Anemia    Congestive heart failure (CHF) ([place]) 02/04/2020   Late effect of cerebrovascular accident (CVA) 11/02/2019   Status post ablation of atrial fibrillation 09/30/2019   Persistent atrial fibrillation ([place]) 09/30/2019   TIA (transient ischemic attack) 09/15/2019   Aortic atherosclerosis ([place]) 09/15/2019   History of transient ischemic attack (TIA) 09/15/2019   Degenerative cervical disc 06/06/2018   Abnormal myocardial perfusion study 10/18/2017   Mild aortic stenosis 10/18/2017   S/P cryoablation of arrhythmia 10/18/2017   History of blood transfusion 09/14/2016   Chronic right-sided low back pain without sciatica 02/29/2016   Chronic right-sided thoracic back pain 02/29/2016   Mitral regurgitation moderate by echocardiogram from 2021 01/17/2016   Essential hypertension 01/17/2016   Anxiety 01/17/2016   Asthma 01/17/2016   Class 2 severe obesity due to excess calories with serious comorbidity and body mass index (BMI) of 35.0 to 35.9 in adult ([place]) 01/17/2016   GERD (gastroesophageal reflux disease) 01/17/2016   High risk medication use 01/17/2016   History of Graves' disease 01/17/2016   Hypothyroidism 01/17/2016   Lipoma 01/17/2016   Major depressive disorder 01/17/2016   Mixed hyperlipidemia 01/17/2016   OA (osteoarthritis) 01/17/2016   Postmenopausal 01/17/2016   Prediabetes 01/17/2016   Primary hyperparathyroidism ([place]) 01/17/2016   Tinea corporis 01/17/2016   PAF (paroxysmal atrial fibrillation) ([place]) 01/17/2016   Class 1 obesity due to excess calories with serious comorbidity and body mass index (BMI) of 33.0 to 33.9 in adult 01/17/2016   Osteoarthritis of left knee 01/18/2015   Status post total left knee replacement 01/18/2015   Past Medical History:  Diagnosis Date   Abnormal myocardial perfusion  study 10/18/2017   Anemia    after hysterectomy at age 66   Anxiety    Aortic atherosclerosis ([place]) 09/15/2019   Aortic valve regurgitation, acquired    01/18/14 TEE ([facility]): mild AI, no AS   Arthritis    osteoarthritis   Asthma    Atrial fibrillation and flutter (HCC)    Chronic right-sided low back pain without sciatica 02/29/2016   Chronic right-sided thoracic back pain 02/29/2016   Class 1 obesity due to excess calories with serious comorbidity and body mass index (BMI) of 33.0 to 33.9 in adult 01/17/2016   Congestive heart failure (CHF) ([place]) 02/04/2020   Degenerative cervical disc 06/06/2018   Essential hypertension 01/17/2016   Family history of adverse reaction to anesthesia    dad said during eye surgery he felt everything   Frequent UTI    hx of - none for a year (as of 01/05/15)   GERD (gastroesophageal reflux disease)    H/O seasonal allergies  High risk medication use 01/17/2016   History of blood transfusion 09/14/2016   History of Graves' disease 01/17/2016   Hyperlipidemia    Hypertension    Hypothyroidism    Late effect of cerebrovascular accident (CVA) 11/02/2019   Lipoma    just under right breast   Major depressive disorder 01/17/2016   Mild aortic stenosis 10/18/2017   Mitral regurgitation moderate by echocardiogram from 2021 01/17/2016   Mitral valve regurgitation    Trace MR/TR by 01/18/14 TEE ([facility])   Mixed hyperlipidemia 01/17/2016   OA (osteoarthritis) 01/17/2016   Obesity (BMI 30-39.9)    Osteoarthritis of left knee 01/18/2015   PAF (paroxysmal atrial fibrillation) ([place]) 01/17/2016   Formatting of this note might be different from the original. Ablation 2015.  TIA 08/2019.  Now on eliquis.   Persistent atrial fibrillation ([place]) 09/30/2019   Postmenopausal 01/17/2016   Prediabetes    Primary hyperparathyroidism ([place]) 01/17/2016   Formatting of this note might be different from the original. Negative w/u in 2015 Abnormal 2021.   S/P cryoablation of arrhythmia 10/18/2017   Status post  ablation of atrial fibrillation 09/30/2019   Status post total left knee replacement 01/18/2015   Thyroid disease    was hyperthyroid, had radiation "Graves Disease"   TIA (transient ischemic attack) 09/15/2019   Tinea corporis 01/17/2016    Family History  Problem Relation Age of Onset   Hypertension Mother    CAD Mother    Heart attack Mother    Heart disease Father    Cancer Father    High Cholesterol Father    High Cholesterol Brother    Hypertension Brother    High Cholesterol Brother    Hypertension Brother    High Cholesterol Daughter    COPD Daughter    Crohn's disease Daughter    Autism spectrum disorder Son     Past Surgical History:  Procedure Laterality Date   APPENDECTOMY  1980   during exploratory surgery for endometriosis   ATRIAL FIBRILLATION ABLATION  01/30/14   BACK SURGERY  1981   lumbar disc removed   CARDIOVERSION  06/25/13   COLONOSCOPY     KNEE ARTHROSCOPY W/ MENISCAL REPAIR Left 2014   ROTATOR CUFF REPAIR Left    throat polyps removed  1979   TONSILLECTOMY     TOTAL KNEE ARTHROPLASTY Left 01/18/2015   Procedure: LEFT TOTAL KNEE ARTHROPLASTY;  Surgeon: Mcarthur Rossetti, MD;  Location: [place];  Service: Orthopedics;  Laterality: Left;   VAGINAL HYSTERECTOMY  1976   Social History   Occupational History   Not on file  Tobacco Use   Smoking status: Former    Types: Cigarettes    Quit date: 01/04/1985    Years since quitting: 36.8   Smokeless tobacco: Never  Vaping Use   Vaping Use: Never used  Substance and Sexual Activity   Alcohol use: Yes    Comment: occasional   Drug use: No   Sexual activity: Not on file

## 2022-02-21 DIAGNOSIS — Z01818 Encounter for other preprocedural examination: Secondary | ICD-10-CM | POA: Diagnosis not present

## 2022-02-22 ENCOUNTER — Telehealth: Payer: Self-pay

## 2022-02-22 NOTE — Telephone Encounter (Signed)
   Pre-operative Risk Assessment    Patient Name: Kim Rice  DOB: [birth year] MRN: [id number]      Request for Surgical Clearance    Procedure:   right reverse total shoulder replacement  Date of Surgery:  Clearance TBD                                 Surgeon:  Dr. Joya Salm Surgeon's Group or Practice Name:  [facility] Phone number:  [phone] Fax number:  [phone]   Type of Clearance Requested:   - Medical    Type of Anesthesia:  General    Additional requests/questions:    SignedLowella Grip   02/22/2022, 10:21 AM

## 2022-02-25 NOTE — Telephone Encounter (Signed)
Patient with diagnosis of atrial fibrillation on Eliquis for anticoagulation.    Procedure: right reverse total shoulder replacement Date of procedure: TBD   CHA2DS2-VASc Score = 7   This indicates a 11.2% annual risk of stroke. The patient's score is based upon: CHF History: 1 HTN History: 1 Diabetes History: 0 Stroke History: 2 Vascular Disease History: 0 Age Score: 2 Gender Score: 1   Patient had TIA 08/2020.  Had been off anticoagulation after AF ablation  CrCl 50 Platelet count 352  Due to history of TIA off anticoagulation and elevated score, will defer to MD for length of hold and if bridging required

## 2022-02-26 NOTE — Telephone Encounter (Signed)
Dr. Bettina Gavia, could you please clarify your message:  4 doses of Eliquis equals 2 days before 4 days of Eliquis equals 3 days after surgery   May the patient hold Eliquis prior to surgery and does she need Lovenox bridge?  Please route your response to p cv div preop  Thank you, Emmaline Life, NP-C    02/26/2022, 4:52 PM [facility] [address] Office [phone] Fax [phone]

## 2022-02-27 ENCOUNTER — Ambulatory Visit (INDEPENDENT_AMBULATORY_CARE_PROVIDER_SITE_OTHER): Payer: Medicare Other | Admitting: Nurse Practitioner

## 2022-02-27 ENCOUNTER — Telehealth: Payer: Self-pay | Admitting: *Deleted

## 2022-02-27 ENCOUNTER — Encounter: Payer: Self-pay | Admitting: Nurse Practitioner

## 2022-02-27 DIAGNOSIS — Z01810 Encounter for preprocedural cardiovascular examination: Secondary | ICD-10-CM | POA: Diagnosis not present

## 2022-02-27 NOTE — Telephone Encounter (Signed)
  Patient Consent for Virtual Visit        [name] has provided verbal consent on 02/27/2022 for a virtual visit (video or telephone).   CONSENT FOR VIRTUAL VISIT FOR:  Kim Rice  By participating in this virtual visit I agree to the following:  I hereby voluntarily request, consent and authorize [facility] and its employed or contracted physicians, physician assistants, nurse practitioners or other licensed health care professionals (the Practitioner), to provide me with telemedicine health care services (the "Services") as deemed necessary by the treating Practitioner. I acknowledge and consent to receive the Services by the Practitioner via telemedicine. I understand that the telemedicine visit will involve communicating with the Practitioner through live audiovisual communication technology and the disclosure of certain medical information by electronic transmission. I acknowledge that I have been given the opportunity to request an in-person assessment or other available alternative prior to the telemedicine visit and am voluntarily participating in the telemedicine visit.  I understand that I have the right to withhold or withdraw my consent to the use of telemedicine in the course of my care at any time, without affecting my right to future care or treatment, and that the Practitioner or I may terminate the telemedicine visit at any time. I understand that I have the right to inspect all information obtained and/or recorded in the course of the telemedicine visit and may receive copies of available information for a reasonable fee.  I understand that some of the potential risks of receiving the Services via telemedicine include:  Delay or interruption in medical evaluation due to technological equipment failure or disruption; Information transmitted may not be sufficient (e.g. poor resolution of images) to allow for appropriate medical decision making by the Practitioner;  and/or  In rare instances, security protocols could fail, causing a breach of personal health information.  Furthermore, I acknowledge that it is my responsibility to provide information about my medical history, conditions and care that is complete and accurate to the best of my ability. I acknowledge that Practitioner's advice, recommendations, and/or decision may be based on factors not within their control, such as incomplete or inaccurate data provided by me or distortions of diagnostic images or specimens that may result from electronic transmissions. I understand that the practice of medicine is not an exact science and that Practitioner makes no warranties or guarantees regarding treatment outcomes. I acknowledge that a copy of this consent can be made available to me via my patient portal ([facility]), or I can request a printed copy by calling the office of [facility].    I understand that my insurance will be billed for this visit.   I have read or had this consent read to me. I understand the contents of this consent, which adequately explains the benefits and risks of the Services being provided via telemedicine.  I have been provided ample opportunity to ask questions regarding this consent and the Services and have had my questions answered to my satisfaction. I give my informed consent for the services to be provided through the use of telemedicine in my medical care

## 2022-02-27 NOTE — Progress Notes (Signed)
Virtual Visit via Telephone Note   Because of Kim Rice co-morbid illnesses, she is at least at moderate risk for complications without adequate follow up.  This format is felt to be most appropriate for this patient at this time.  The patient did not have access to video technology/had technical difficulties with video requiring transitioning to audio format only (telephone).  All issues noted in this document were discussed and addressed.  No physical exam could be performed with this format.  Please refer to the patient's chart for her consent to telehealth for [facility].  Evaluation Performed:  Preoperative cardiovascular risk assessment _____________   Date:  02/27/2022   Patient ID:  Kim Rice, DOB 1946/01/07, MRN [id number] Patient Location:  Home Provider location:   Office  Primary Care Provider:  Raina Mina., MD Primary Cardiologist:  Jenne Campus, MD  Chief Complaint / Patient Profile   76 y.o. y/o female with a h/o PAF s/p remote ablation, TIA, mild aortic stenosis, obesity, hyperlipidemia, mild mitral regurgitation who is pending right reverse total shoulder replacement and presents today for telephonic preoperative cardiovascular risk assessment.  Past Medical History    Past Medical History:  Diagnosis Date   Abnormal myocardial perfusion study 10/18/2017   Anemia    after hysterectomy at age 54   Anxiety    Aortic atherosclerosis ([place]) 09/15/2019   Aortic valve regurgitation, acquired    01/18/14 TEE ([facility]): mild AI, no AS   Arthritis    osteoarthritis   Asthma    Atrial fibrillation and flutter (HCC)    Chronic right-sided low back pain without sciatica 02/29/2016   Chronic right-sided thoracic back pain 02/29/2016   Class 1 obesity due to excess calories with serious comorbidity and body mass index (BMI) of 33.0 to 33.9 in adult 01/17/2016   Congestive heart failure (CHF) ([place]) 02/04/2020   Degenerative cervical disc 06/06/2018    Essential hypertension 01/17/2016   Family history of adverse reaction to anesthesia    dad said during eye surgery he felt everything   Frequent UTI    hx of - none for a year (as of 01/05/15)   GERD (gastroesophageal reflux disease)    H/O seasonal allergies    High risk medication use 01/17/2016   History of blood transfusion 09/14/2016   History of Graves' disease 01/17/2016   Hyperlipidemia    Hypertension    Hypothyroidism    Late effect of cerebrovascular accident (CVA) 11/02/2019   Lipoma    just under right breast   Major depressive disorder 01/17/2016   Mild aortic stenosis 10/18/2017   Mitral regurgitation moderate by echocardiogram from 2021 01/17/2016   Mitral valve regurgitation    Trace MR/TR by 01/18/14 TEE ([facility])   Mixed hyperlipidemia 01/17/2016   OA (osteoarthritis) 01/17/2016   Obesity (BMI 30-39.9)    Osteoarthritis of left knee 01/18/2015   PAF (paroxysmal atrial fibrillation) ([place]) 01/17/2016   Formatting of this note might be different from the original. Ablation 2015.  TIA 08/2019.  Now on eliquis.   Persistent atrial fibrillation ([place]) 09/30/2019   Postmenopausal 01/17/2016   Prediabetes    Primary hyperparathyroidism ([place]) 01/17/2016   Formatting of this note might be different from the original. Negative w/u in 2015 Abnormal 2021.   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   Thyroid disease    was hyperthyroid, had radiation "Graves Disease"  TIA (transient ischemic attack) 09/15/2019   Tinea corporis 01/17/2016   Past Surgical History:  Procedure Laterality Date   APPENDECTOMY  1980   during exploratory surgery for endometriosis   ATRIAL FIBRILLATION ABLATION  01/30/14   BACK SURGERY  1981   lumbar disc removed   CARDIOVERSION  06/25/13   COLONOSCOPY     KNEE ARTHROSCOPY W/ MENISCAL REPAIR Left 2014   ROTATOR CUFF REPAIR Left    throat polyps removed  1979    TONSILLECTOMY     TOTAL KNEE ARTHROPLASTY Left 01/18/2015   Procedure: LEFT TOTAL KNEE ARTHROPLASTY;  Surgeon: Mcarthur Rossetti, MD;  Location: [facility];  Service: Orthopedics;  Laterality: Left;   VAGINAL HYSTERECTOMY  1976    Allergies  Allergies  Allergen Reactions   Codeine Other (See Comments)    Made her hyper   Latex Rash    History of Present Illness    Kim Rice is a 76 y.o. female who presents via audio/video conferencing for a telehealth visit today.  Pt was last seen in cardiology clinic on 09/26/2021 by Dr. Agustin Cree.  At that time Kim Rice was doing well.  The patient is now pending procedure as outlined above. Since her last visit, she denies chest pain, shortness of breath, lower extremity edema, fatigue, palpitations, melena, hematuria, hemoptysis, diaphoresis, weakness, presyncope, syncope, orthopnea, and PND.   Home Medications    Prior to Admission medications   Medication Sig Start Date End Date Taking? Authorizing Provider  albuterol (VENTOLIN HFA) 108 (90 Base) MCG/ACT inhaler Inhale 2 puffs into the lungs every 6 (six) hours as needed for wheezing or shortness of breath.    [provider]  apixaban (ELIQUIS) 5 MG TABS tablet Take 5 mg by mouth 2 (two) times daily. 09/15/19   [provider]  aspirin 81 MG tablet Take 81 mg by mouth daily.    [provider]  atenolol (TENORMIN) 100 MG tablet Take 50 mg by mouth in the morning and at bedtime.    [provider]  atorvastatin (LIPITOR) 80 MG tablet Take 80 mg by mouth daily.  09/15/19   [provider]  EPINEPHrine 0.3 mg/0.3 mL IJ SOAJ injection Inject 0.3 mg into the muscle as needed for anaphylaxis. 05/05/20   [provider]  escitalopram (LEXAPRO) 10 MG tablet Take 10 mg by mouth at bedtime.  09/14/16   [provider]  esomeprazole (NEXIUM) 40 MG capsule Take 40 mg by mouth every evening.     [provider]  ezetimibe  (ZETIA) 10 MG tablet Take 1 tablet (10 mg total) by mouth daily. 09/02/20 09/26/21  Park Liter, MD  fluticasone (FLONASE) 50 MCG/ACT nasal spray Place 1 spray into both nostrils as needed for allergies. 10/20/18   [provider]  Fluticasone-Salmeterol (ADVAIR) 250-50 MCG/DOSE AEPB Inhale 1 puff into the lungs 2 (two) times daily as needed (wheezing).    [provider]  furosemide (LASIX) 80 MG tablet Take 1.5 tablets (120 mg total) by mouth daily. 08/25/21 11/23/21  Park Liter, MD  gabapentin (NEURONTIN) 300 MG capsule Take 300 mg by mouth daily as needed (nerve pain).    [provider]  levothyroxine (SYNTHROID) 88 MCG tablet Take 88 mcg by mouth every morning. 03/10/21   [provider]  lisinopril (ZESTRIL) 10 MG tablet Take 10 mg by mouth daily. TAKE 0.5 TABLET (5 mg) DAILY    [provider]  nitroGLYCERIN (NITROSTAT) 0.4 MG SL tablet Place  1 tablet (0.4 mg total) under the tongue every 5 (five) minutes as needed. Patient taking differently: Place 0.4 mg under the tongue every 5 (five) minutes as needed for chest pain. 06/06/20 09/26/21  Tobb, Kardie, DO  potassium chloride (KLOR-CON) 10 MEQ tablet Take 1 tablet (10 mEq total) by mouth daily. 04/01/20 09/26/21  Park Liter, MD    Physical Exam    Vital Signs:  [name] does not have vital signs available for review today.  Given telephonic nature of communication, physical exam is limited. AAOx3. NAD. Normal affect.  Speech and respirations are unlabored.  Accessory Clinical Findings    None  Assessment & Plan    1.  Preoperative Cardiovascular Risk Assessment: She is doing well from a cardiac perspective and may proceed without further testing. According to the Revised Cardiac Risk Index (RCRI), her Perioperative Risk of Major Cardiac Event is (%): 0.9. Her Functional Capacity in METs is: 7.59 according to the [facility] Activity Status Index (DASI).  Per Dr. Bettina Gavia,  she may hold Eliquis for 2 days prior to surgery and resume as soon as possible after the procedure with request not to exceed 4 total days off Eliquis. No bridging with Lovenox is required.   A copy of this note will be routed to requesting surgeon.  Time:   Today, I have spent 10 minutes with the patient with telehealth technology discussing medical history, symptoms, and management plan.    Emmaline Life, NP-C    02/27/2022, 2:44 PM [facility] [address] Office [phone] Fax [phone]

## 2022-02-27 NOTE — Telephone Encounter (Signed)
Spoke with patient and scheduled her for a pre-op telehealth appointment for today at 2:40 PM.

## 2022-02-27 NOTE — Telephone Encounter (Signed)
Primary Cardiologist:None  Chart reviewed as part of pre-operative protocol coverage. Because of Kim Rice past medical history and time since last visit, he/she will require a virtual visit/telephone call in order to better assess preoperative cardiovascular risk.  Pre-op covering staff: - Please contact patient, obtain consent, and schedule appointment   Per Dr. Bettina Gavia, patient may hold Eliquis for 2 days prior to surgery and resume as soon as possible after the procedure, with request not to exceed 4 total days off Eliquis. She does not require bridging with Lovenox.   Emmaline Life, NP-C    02/27/2022, 8:07 AM [facility] [address], [address] Office [phone] Fax [phone]

## 2022-05-08 ENCOUNTER — Encounter: Payer: Self-pay | Admitting: Cardiology

## 2022-05-08 ENCOUNTER — Ambulatory Visit (INDEPENDENT_AMBULATORY_CARE_PROVIDER_SITE_OTHER): Payer: Medicare Other | Admitting: Cardiology

## 2022-05-08 VITALS — BP 122/60 | HR 62 | Ht 62.0 in | Wt 188.4 lb

## 2022-05-08 DIAGNOSIS — I10 Essential (primary) hypertension: Secondary | ICD-10-CM

## 2022-05-08 DIAGNOSIS — E782 Mixed hyperlipidemia: Secondary | ICD-10-CM | POA: Diagnosis not present

## 2022-05-08 DIAGNOSIS — I480 Paroxysmal atrial fibrillation: Secondary | ICD-10-CM

## 2022-05-08 DIAGNOSIS — I340 Nonrheumatic mitral (valve) insufficiency: Secondary | ICD-10-CM | POA: Diagnosis not present

## 2022-05-08 NOTE — Progress Notes (Signed)
Cardiology Office Note:    Date:  05/08/2022   ID:  Kim Rice, DOB January 01, 1946, MRN [id number]  PCP:  Raina Mina., MD  Cardiologist:  Jenne Campus, MD    Referring MD: Raina Mina., MD   Chief Complaint  Patient presents with   Follow-up    History of Present Illness:    Kim Rice is a 76 y.o. female with past medical history significant for paroxysmal atrial fibrillation, status post atrial fibrillation ablation done many years ago, after that her anticoagulation has been discontinued and then she presented in December 2021 with episode of TIA and she was back in atrial fibrillation, she converted spontaneously to sinus rhythm, anticoagulation has been reinitiated.  Additional problem also include mild aortic stenosis. She comes today 2 months for follow-up.  She did have right shoulder replacement surgery.  She did well and doing well happy with the surgery which she had done  Past Medical History:  Diagnosis Date   Abnormal myocardial perfusion study 10/18/2017   Anemia    after hysterectomy at age 73   Anxiety    Aortic atherosclerosis ([place]) 09/15/2019   Aortic valve regurgitation, acquired    01/18/14 TEE ([facility]): mild AI, no AS   Arthritis    osteoarthritis   Asthma    Atrial fibrillation and flutter ([place])    Chronic right-sided low back pain without sciatica 02/29/2016   Chronic right-sided thoracic back pain 02/29/2016   Class 1 obesity due to excess calories with serious comorbidity and body mass index (BMI) of 33.0 to 33.9 in adult 01/17/2016   Congestive heart failure (CHF) ([place]) 02/04/2020   Degenerative cervical disc 06/06/2018   Essential hypertension 01/17/2016   Family history of adverse reaction to anesthesia    dad said during eye surgery he felt everything   Frequent UTI    hx of - none for a year (as of 01/05/15)   GERD (gastroesophageal reflux disease)    H/O seasonal allergies    High risk medication use 01/17/2016   History of blood  transfusion 09/14/2016   History of Graves' disease 01/17/2016   Hyperlipidemia    Hypertension    Hypothyroidism    Late effect of cerebrovascular accident (CVA) 11/02/2019   Lipoma    just under right breast   Major depressive disorder 01/17/2016   Mild aortic stenosis 10/18/2017   Mitral regurgitation moderate by echocardiogram from 2021 01/17/2016   Mitral valve regurgitation    Trace MR/TR by 01/18/14 TEE ([facility])   Mixed hyperlipidemia 01/17/2016   OA (osteoarthritis) 01/17/2016   Obesity (BMI 30-39.9)    Osteoarthritis of left knee 01/18/2015   PAF (paroxysmal atrial fibrillation) ([place]) 01/17/2016   Formatting of this note might be different from the original. Ablation 2015.  TIA 08/2019.  Now on eliquis.   Persistent atrial fibrillation ([place]) 09/30/2019   Postmenopausal 01/17/2016   Prediabetes    Primary hyperparathyroidism ([place]) 01/17/2016   Formatting of this note might be different from the original. Negative w/u in 2015 Abnormal 2021.   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   Thyroid disease    was hyperthyroid, had radiation "Graves Disease"   TIA (transient ischemic attack) 09/15/2019   Tinea corporis 01/17/2016    Past Surgical History:  Procedure Laterality Date   APPENDECTOMY  1980   during exploratory surgery for endometriosis   ATRIAL FIBRILLATION ABLATION  01/30/14  BACK SURGERY  1981   lumbar disc removed   CARDIOVERSION  06/25/13   COLONOSCOPY     KNEE ARTHROSCOPY W/ MENISCAL REPAIR Left 2014   ROTATOR CUFF REPAIR Left    throat polyps removed  1979   TONSILLECTOMY     TOTAL KNEE ARTHROPLASTY Left 01/18/2015   Procedure: LEFT TOTAL KNEE ARTHROPLASTY;  Surgeon: Mcarthur Rossetti, MD;  Location: [facility];  Service: Orthopedics;  Laterality: Left;   VAGINAL HYSTERECTOMY  1976    Current Medications: Current Meds  Medication Sig   albuterol (VENTOLIN HFA) 108 (90  Base) MCG/ACT inhaler Inhale 2 puffs into the lungs every 6 (six) hours as needed for wheezing or shortness of breath.   apixaban (ELIQUIS) 5 MG TABS tablet Take 5 mg by mouth 2 (two) times daily.   aspirin 81 MG tablet Take 81 mg by mouth daily.   atenolol (TENORMIN) 100 MG tablet Take 50 mg by mouth in the morning and at bedtime.   atorvastatin (LIPITOR) 80 MG tablet Take 80 mg by mouth daily.    EPINEPHrine 0.3 mg/0.3 mL IJ SOAJ injection Inject 0.3 mg into the muscle as needed for anaphylaxis.   escitalopram (LEXAPRO) 10 MG tablet Take 10 mg by mouth at bedtime.    esomeprazole (NEXIUM) 40 MG capsule Take 40 mg by mouth every evening.    ezetimibe (ZETIA) 10 MG tablet Take 1 tablet (10 mg total) by mouth daily.   fluticasone (FLONASE) 50 MCG/ACT nasal spray Place 1 spray into both nostrils as needed for allergies.   Fluticasone-Salmeterol (ADVAIR) 250-50 MCG/DOSE AEPB Inhale 1 puff into the lungs 2 (two) times daily as needed (wheezing).   furosemide (LASIX) 80 MG tablet Take 1.5 tablets (120 mg total) by mouth daily.   gabapentin (NEURONTIN) 300 MG capsule Take 300 mg by mouth daily as needed (nerve pain).   levothyroxine (SYNTHROID) 88 MCG tablet Take 88 mcg by mouth every morning.   lisinopril (ZESTRIL) 10 MG tablet Take 10 mg by mouth daily. TAKE 0.5 TABLET (5 mg) DAILY   nitroGLYCERIN (NITROSTAT) 0.4 MG SL tablet Place 1 tablet (0.4 mg total) under the tongue every 5 (five) minutes as needed. (Patient taking differently: Place 0.4 mg under the tongue every 5 (five) minutes as needed for chest pain.)   potassium chloride (KLOR-CON) 10 MEQ tablet Take 1 tablet (10 mEq total) by mouth daily.     Allergies:   Codeine and Latex   Social History   Socioeconomic History   Marital status: Widowed    Spouse name: Not on file   Number of children: Not on file   Years of education: Not on file   Highest education level: Not on file  Occupational History   Not on file  Tobacco Use    Smoking status: Former    Types: Cigarettes    Quit date: 01/04/1985    Years since quitting: 37.3   Smokeless tobacco: Never  Vaping Use   Vaping Use: Never used  Substance and Sexual Activity   Alcohol use: Yes    Comment: occasional   Drug use: No   Sexual activity: Not on file  Other Topics Concern   Not on file  Social History Narrative   Not on file   Social Determinants of Health   Financial Resource Strain: Not on file  Food Insecurity: Not on file  Transportation Needs: Not on file  Physical Activity: Not on file  Stress: Not on file  Social Connections: Not  on file     Family History: The patient's family history includes Autism spectrum disorder in her son; CAD in her mother; COPD in her daughter; Cancer in her father; Crohn's disease in her daughter; Heart attack in her mother; Heart disease in her father; High Cholesterol in her brother, brother, daughter, and father; Hypertension in her brother, brother, and mother. ROS:   Please see the history of present illness.    All 14 point review of systems negative except as described per history of present illness  EKGs/Labs/Other Studies Reviewed:      Recent Labs: No results found for requested labs within last 365 days.  Recent Lipid Panel    Component Value Date/Time   CHOL 162 06/07/2021 0804   TRIG 190 (H) 06/07/2021 0804   HDL 53 06/07/2021 0804   CHOLHDL 3.1 06/07/2021 0804   LDLCALC 77 06/07/2021 0804    Physical Exam:    VS:  BP 122/60 (BP Location: Left Arm, Patient Position: Sitting)   Pulse 62   Ht '5\' 2"'$  (1.575 m)   Wt 188 lb 6.4 oz (85.5 kg)   SpO2 94%   BMI 34.46 kg/m     Wt Readings from Last 3 Encounters:  05/08/22 188 lb 6.4 oz (85.5 kg)  09/26/21 192 lb 6.4 oz (87.3 kg)  05/24/21 191 lb 6.4 oz (86.8 kg)     GEN:  Well nourished, well developed in no acute distress HEENT: Normal NECK: No JVD; No carotid bruits LYMPHATICS: No lymphadenopathy CARDIAC: RRR, 60 ejection murmur  grade 2/6 best heard right upper portion of the sternum, no rubs, no gallops RESPIRATORY:  Clear to auscultation without rales, wheezing or rhonchi  ABDOMEN: Soft, non-tender, non-distended MUSCULOSKELETAL:  No edema; No deformity  SKIN: Warm and dry LOWER EXTREMITIES: no swelling NEUROLOGIC:  Alert and oriented x 3 PSYCHIATRIC:  Normal affect   ASSESSMENT:    1. PAF (paroxysmal atrial fibrillation) ([place])   2. Essential hypertension   3. Nonrheumatic mitral valve regurgitation   4. Mixed hyperlipidemia    PLAN:    In order of problems listed above:  Paroxysmal atrial fibrillation maintained sinus rhythm continue anticoagulation denies have any palpitations.  Hypertension blood pressure seems to well controlled continue present management. Nonrheumatic mitral valve regurgitation only mild.  Continue observations Dyslipidemia did review K PN which only her LDL of 77 HDL 53 she is on Lipitor 80 and Zetia which is high intense statin which I will continue. Aortic stenosis only mild continue monitoring   Medication Adjustments/Labs and Tests Ordered: Current medicines are reviewed at length with the patient today.  Concerns regarding medicines are outlined above.  No orders of the defined types were placed in this encounter.  Medication changes: No orders of the defined types were placed in this encounter.   Signed, Park Liter, MD, [facility] 05/08/2022 11:59 AM    [place]

## 2022-05-08 NOTE — Patient Instructions (Signed)

## 2022-09-02 DIAGNOSIS — J189 Pneumonia, unspecified organism: Secondary | ICD-10-CM | POA: Insufficient documentation

## 2022-09-04 ENCOUNTER — Telehealth: Payer: Self-pay | Admitting: Cardiology

## 2022-09-04 NOTE — Telephone Encounter (Signed)
Spoke with pt. Will send message to front desk to have pt worked in for hospital follow up.

## 2022-09-04 NOTE — Telephone Encounter (Signed)
Patient states she is returning a call--assumes it was regarding previous request for a sooner appointment. She states that she is on her way home and is requesting that her call be returned after 12:40 PM if at all possible.

## 2022-09-04 NOTE — Telephone Encounter (Signed)
Patient states she was seen in the hospital for pneumonia and they ended up finding things wrong with her heart problem. She says she does not know what it is, but they did a sonogram and are very concerned. She says they want her seen within a week, but the soonest I could find was 1/17 in [place]. She is also on the waitlist. She would like a call back to let her know if she needs to be seen sooner. She says she is still in the hospital, but is going to be discharged today and has her paperwork. Phone: [phone]

## 2022-09-14 ENCOUNTER — Encounter: Payer: Self-pay | Admitting: Cardiology

## 2022-09-14 ENCOUNTER — Ambulatory Visit: Payer: Medicare Other | Attending: Cardiology | Admitting: Cardiology

## 2022-09-14 VITALS — BP 110/70 | HR 66 | Ht 62.0 in | Wt 182.0 lb

## 2022-09-14 DIAGNOSIS — I4892 Unspecified atrial flutter: Secondary | ICD-10-CM | POA: Diagnosis present

## 2022-09-14 DIAGNOSIS — R0609 Other forms of dyspnea: Secondary | ICD-10-CM | POA: Diagnosis present

## 2022-09-14 DIAGNOSIS — Z8679 Personal history of other diseases of the circulatory system: Secondary | ICD-10-CM | POA: Diagnosis present

## 2022-09-14 DIAGNOSIS — I480 Paroxysmal atrial fibrillation: Secondary | ICD-10-CM

## 2022-09-14 DIAGNOSIS — I4891 Unspecified atrial fibrillation: Secondary | ICD-10-CM | POA: Diagnosis not present

## 2022-09-14 DIAGNOSIS — Z9889 Other specified postprocedural states: Secondary | ICD-10-CM | POA: Diagnosis present

## 2022-09-14 DIAGNOSIS — I340 Nonrheumatic mitral (valve) insufficiency: Secondary | ICD-10-CM

## 2022-09-14 DIAGNOSIS — I10 Essential (primary) hypertension: Secondary | ICD-10-CM | POA: Diagnosis not present

## 2022-09-14 MED ORDER — DAPAGLIFLOZIN PROPANEDIOL 10 MG PO TABS: 10.0000 mg | ORAL_TABLET | Freq: Every day | ORAL | 3 refills | Status: AC

## 2022-09-14 NOTE — Progress Notes (Signed)
Cardiology Office Note:    Date:  09/14/2022   ID:  Kim Rice, DOB 14-Feb-1946, MRN [id number]  PCP:  Raina Mina., MD  Cardiologist:  Jenne Campus, MD    Referring MD: Raina Mina., MD   Chief Complaint  Patient presents with   Hospitalization Follow-up    [facility]    History of Present Illness:    Kim Rice is a 76 y.o. female with past medical history significant for paroxysmal atrial fibrillation, status post atrial fibrillation ablation done many years ago done she was taken off anticoagulation and up coming to the hospital in December 2021 with TIA.  She was back in atrial fibrillation, she converted spontaneously, anticoagulation has been restarted.  Additional problem include some COPD, mild aortic stenosis.  Recently she ended up being admitted to [facility] secondary to respiratory distress.  Conclusion was it was a combination of diastolic congestive heart failure, pneumonia/bronchitis, COPD.  Manage appropriately discharged home.  Now doing well.  Past Medical History:  Diagnosis Date   Abnormal myocardial perfusion study 10/18/2017   Anemia    after hysterectomy at age 49   Anxiety    Aortic atherosclerosis ([place]) 09/15/2019   Aortic valve regurgitation, acquired    01/18/14 TEE (HPR): mild AI, no AS   Arthritis    osteoarthritis   Asthma    Atrial fibrillation and flutter (HCC)    Chronic right-sided low back pain without sciatica 02/29/2016   Chronic right-sided thoracic back pain 02/29/2016   Class 1 obesity due to excess calories with serious comorbidity and body mass index (BMI) of 33.0 to 33.9 in adult 01/17/2016   Congestive heart failure (CHF) ([place]) 02/04/2020   Degenerative cervical disc 06/06/2018   Essential hypertension 01/17/2016   Family history of adverse reaction to anesthesia    dad said during eye surgery he felt everything   Frequent UTI    hx of - none for a year (as of 01/05/15)   GERD (gastroesophageal reflux disease)    H/O  seasonal allergies    High risk medication use 01/17/2016   History of blood transfusion 09/14/2016   History of Graves' disease 01/17/2016   Hyperlipidemia    Hypertension    Hypothyroidism    Late effect of cerebrovascular accident (CVA) 11/02/2019   Lipoma    just under right breast   Major depressive disorder 01/17/2016   Mild aortic stenosis 10/18/2017   Mitral regurgitation moderate by echocardiogram from 2021 01/17/2016   Mitral valve regurgitation    Trace MR/TR by 01/18/14 TEE (HPR)   Mixed hyperlipidemia 01/17/2016   OA (osteoarthritis) 01/17/2016   Obesity (BMI 30-39.9)    Osteoarthritis of left knee 01/18/2015   PAF (paroxysmal atrial fibrillation) ([place]) 01/17/2016   Formatting of this note might be different from the original. Ablation 2015.  TIA 08/2019.  Now on eliquis.   Persistent atrial fibrillation ([place]) 09/30/2019   Postmenopausal 01/17/2016   Prediabetes    Primary hyperparathyroidism ([place]) 01/17/2016   Formatting of this note might be different from the original. Negative w/u in 2015 Abnormal 2021.   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   Thyroid disease    was hyperthyroid, had radiation "Graves Disease"   TIA (transient ischemic attack) 09/15/2019   Tinea corporis 01/17/2016    Past Surgical History:  Procedure Laterality Date   APPENDECTOMY  1980   during exploratory  surgery for endometriosis   ATRIAL FIBRILLATION ABLATION  01/30/14   BACK SURGERY  1981   lumbar disc removed   CARDIOVERSION  06/25/13   COLONOSCOPY     KNEE ARTHROSCOPY W/ MENISCAL REPAIR Left 2014   ROTATOR CUFF REPAIR Left    throat polyps removed  1979   TONSILLECTOMY     TOTAL KNEE ARTHROPLASTY Left 01/18/2015   Procedure: LEFT TOTAL KNEE ARTHROPLASTY;  Surgeon: Mcarthur Rossetti, MD;  Location: [facility];  Service: Orthopedics;  Laterality: Left;   VAGINAL HYSTERECTOMY  1976    Current  Medications: Current Meds  Medication Sig   albuterol (VENTOLIN HFA) 108 (90 Base) MCG/ACT inhaler Inhale 2 puffs into the lungs every 6 (six) hours as needed for wheezing or shortness of breath.   apixaban (ELIQUIS) 5 MG TABS tablet Take 5 mg by mouth 2 (two) times daily.   aspirin 81 MG tablet Take 81 mg by mouth daily.   atenolol (TENORMIN) 100 MG tablet Take 50 mg by mouth in the morning and at bedtime.   atorvastatin (LIPITOR) 80 MG tablet Take 80 mg by mouth daily.    dapagliflozin propanediol (FARXIGA) 10 MG TABS tablet Take 1 tablet by mouth daily.   EPINEPHrine 0.3 mg/0.3 mL IJ SOAJ injection Inject 0.3 mg into the muscle as needed for anaphylaxis.   escitalopram (LEXAPRO) 10 MG tablet Take 10 mg by mouth at bedtime.    esomeprazole (NEXIUM) 40 MG capsule Take 40 mg by mouth every evening.    ezetimibe (ZETIA) 10 MG tablet Take 1 tablet (10 mg total) by mouth daily.   fluticasone (FLONASE) 50 MCG/ACT nasal spray Place 1 spray into both nostrils as needed for allergies.   Fluticasone-Salmeterol (ADVAIR) 250-50 MCG/DOSE AEPB Inhale 1 puff into the lungs 2 (two) times daily as needed (wheezing).   furosemide (LASIX) 80 MG tablet Take 1.5 tablets (120 mg total) by mouth daily.   gabapentin (NEURONTIN) 300 MG capsule Take 300 mg by mouth daily as needed (nerve pain).   levothyroxine (SYNTHROID) 88 MCG tablet Take 88 mcg by mouth every morning.   lisinopril (ZESTRIL) 10 MG tablet Take 10 mg by mouth daily. TAKE 0.5 TABLET (5 mg) DAILY   nitroGLYCERIN (NITROSTAT) 0.4 MG SL tablet Place 1 tablet (0.4 mg total) under the tongue every 5 (five) minutes as needed. (Patient taking differently: Place 0.4 mg under the tongue every 5 (five) minutes as needed for chest pain.)     Allergies:   Codeine and Latex   Social History   Socioeconomic History   Marital status: Widowed    Spouse name: Not on file   Number of children: Not on file   Years of education: Not on file   Highest education  level: Not on file  Occupational History   Not on file  Tobacco Use   Smoking status: Former    Types: Cigarettes    Quit date: 01/04/1985    Years since quitting: 37.7    Passive exposure: Past   Smokeless tobacco: Never  Vaping Use   Vaping Use: Never used  Substance and Sexual Activity   Alcohol use: Yes    Comment: occasional   Drug use: No   Sexual activity: Not on file  Other Topics Concern   Not on file  Social History Narrative   Not on file   Social Determinants of Health   Financial Resource Strain: Not on file  Food Insecurity: Not on file  Transportation Needs: Not on  file  Physical Activity: Not on file  Stress: Not on file  Social Connections: Not on file     Family History: The patient's family history includes Autism spectrum disorder in her son; CAD in her mother; COPD in her daughter; Cancer in her father; Crohn's disease in her daughter; Heart attack in her mother; Heart disease in her father; High Cholesterol in her brother, brother, daughter, and father; Hypertension in her brother, brother, and mother. ROS:   Please see the history of present illness.    All 14 point review of systems negative except as described per history of present illness  EKGs/Labs/Other Studies Reviewed:      Recent Labs: No results found for requested labs within last 365 days.  Recent Lipid Panel    Component Value Date/Time   CHOL 162 06/07/2021 0804   TRIG 190 (H) 06/07/2021 0804   HDL 53 06/07/2021 0804   CHOLHDL 3.1 06/07/2021 0804   LDLCALC 77 06/07/2021 0804    Physical Exam:    VS:  BP 110/70 (BP Location: Right Arm, Patient Position: Sitting, Cuff Size: Normal)   Pulse 66   Ht '5\' 2"'$  (1.575 m)   Wt 182 lb (82.6 kg)   SpO2 95%   BMI 33.29 kg/m     Wt Readings from Last 3 Encounters:  09/14/22 182 lb (82.6 kg)  05/08/22 188 lb 6.4 oz (85.5 kg)  09/26/21 192 lb 6.4 oz (87.3 kg)     GEN:  Well nourished, well developed in no acute distress HEENT:  Normal NECK: No JVD; No carotid bruits LYMPHATICS: No lymphadenopathy CARDIAC: RRR, systolic murmur grade over 6 best the right upper portion of sternum, no rubs, no gallops RESPIRATORY:  Clear to auscultation without rales, wheezing or rhonchi  ABDOMEN: Soft, non-tender, non-distended MUSCULOSKELETAL:  No edema; No deformity  SKIN: Warm and dry LOWER EXTREMITIES: no swelling NEUROLOGIC:  Alert and oriented x 3 PSYCHIATRIC:  Normal affect   ASSESSMENT:    1. Atrial fibrillation and flutter ([place])   2. Primary hypertension   3. PAF (paroxysmal atrial fibrillation) ([place])   4. Nonrheumatic mitral valve regurgitation   5. S/P cryoablation of arrhythmia    PLAN:    In order of problems listed above:  Paroxysmal atrial flutter fibrillation stable sinus rhythm.  On Eliquis 5 twice daily which I will continue Essential hypertension blood pressure well-controlled continue present management. Diastolic congestive heart failure.  She is on appropriate medication I will check proBNP as well as Chem-7 today. History of atrial fibrillation ablation.  Noted COPD.  Now improved after recent hospitalization   Medication Adjustments/Labs and Tests Ordered: Current medicines are reviewed at length with the patient today.  Concerns regarding medicines are outlined above.  No orders of the defined types were placed in this encounter.  Medication changes: No orders of the defined types were placed in this encounter.   Signed, Park Liter, MD, [facility] 09/14/2022 2:15 PM    [facility]

## 2022-09-14 NOTE — Addendum Note (Signed)
Addended by: Jacobo Forest D on: 09/14/2022 02:27 PM   Modules accepted: Orders

## 2022-09-14 NOTE — Patient Instructions (Addendum)
Medication Instructions:   Wilder Glade '10mg'$  1 tablet daily   Lab Work: ProBNP, BMP- Today If you have labs (blood work) drawn today and your tests are completely normal, you will receive your results only by: MyChart Message (if you have MyChart) OR A paper copy in the mail If you have any lab test that is abnormal or we need to change your treatment, we will call you to review the results.   Testing/Procedures: None Ordered   Follow-Up: At [facility], you and your health needs are our priority.  As part of our continuing mission to provide you with exceptional heart care, we have created designated Provider Care Teams.  These Care Teams include your primary Cardiologist (physician) and Advanced Practice Providers (APPs -  Physician Assistants and Nurse Practitioners) who all work together to provide you with the care you need, when you need it.  We recommend signing up for the patient portal called "MyChart".  Sign up information is provided on this After Visit Summary.  MyChart is used to connect with patients for Virtual Visits (Telemedicine).  Patients are able to view lab/test results, encounter notes, upcoming appointments, etc.  Non-urgent messages can be sent to your provider as well.   To learn more about what you can do with MyChart, go to [email or web address].    Your next appointment:   3 month(s)  The format for your next appointment:   In Person  Provider:   Jenne Campus, MD    Other Instructions NA

## 2022-09-15 LAB — BASIC METABOLIC PANEL
BUN/Creatinine Ratio: 14 (ref 12–28)
BUN: 13 mg/dL (ref 8–27)
CO2: 26 mmol/L (ref 20–29)
Calcium: 10 mg/dL (ref 8.7–10.3)
Chloride: 101 mmol/L (ref 96–106)
Creatinine, Ser: 0.9 mg/dL (ref 0.57–1.00)
Glucose: 100 mg/dL — ABNORMAL HIGH (ref 70–99)
Potassium: 4.5 mmol/L (ref 3.5–5.2)
Sodium: 144 mmol/L (ref 134–144)
eGFR: 66 mL/min/{1.73_m2} (ref >59)

## 2022-09-15 LAB — PRO B NATRIURETIC PEPTIDE: NT-Pro BNP: 235 pg/mL (ref 0–738)

## 2022-09-21 ENCOUNTER — Telehealth: Payer: Self-pay

## 2022-09-21 NOTE — Telephone Encounter (Signed)
Patient notified of results through my chart.

## 2022-09-21 NOTE — Telephone Encounter (Signed)
-----   Message from Park Liter, MD sent at 09/20/2022  9:47 AM EST ----- Labs are good, continue present management

## 2022-09-26 ENCOUNTER — Ambulatory Visit: Payer: Medicare Other | Admitting: Cardiology

## 2022-10-03 ENCOUNTER — Ambulatory Visit: Payer: Medicare Other | Admitting: Cardiology

## 2022-11-09 ENCOUNTER — Encounter: Payer: Self-pay | Admitting: Gastroenterology

## 2022-11-09 ENCOUNTER — Ambulatory Visit: Payer: Medicare Other | Admitting: Cardiology

## 2022-12-14 ENCOUNTER — Ambulatory Visit: Payer: Medicare Other | Attending: Cardiology | Admitting: Cardiology

## 2022-12-14 ENCOUNTER — Encounter: Payer: Self-pay | Admitting: Cardiology

## 2022-12-14 VITALS — BP 110/60 | HR 58 | Ht 62.0 in | Wt 182.0 lb

## 2022-12-14 DIAGNOSIS — E785 Hyperlipidemia, unspecified: Secondary | ICD-10-CM | POA: Diagnosis present

## 2022-12-14 DIAGNOSIS — I350 Nonrheumatic aortic (valve) stenosis: Secondary | ICD-10-CM | POA: Insufficient documentation

## 2022-12-14 DIAGNOSIS — R0789 Other chest pain: Secondary | ICD-10-CM | POA: Diagnosis present

## 2022-12-14 DIAGNOSIS — R0609 Other forms of dyspnea: Secondary | ICD-10-CM | POA: Diagnosis present

## 2022-12-14 DIAGNOSIS — I480 Paroxysmal atrial fibrillation: Secondary | ICD-10-CM | POA: Insufficient documentation

## 2022-12-14 MED ORDER — NITROGLYCERIN 0.4 MG SL SUBL: 0.4000 mg | SUBLINGUAL_TABLET | SUBLINGUAL | 3 refills | Status: AC | PRN

## 2022-12-14 NOTE — Addendum Note (Signed)
Addended by: Jacobo Forest D on: 12/14/2022 10:29 AM   Modules accepted: Orders

## 2022-12-14 NOTE — Progress Notes (Signed)
Cardiology Office Note:    Date:  12/14/2022   ID:  Kim Rice, DOB Feb 09, 1946, MRN [id number]  PCP:  Raina Mina., MD  Cardiologist:  Jenne Campus, MD    Referring MD: Raina Mina., MD   Chief Complaint  Patient presents with   Follow-up  I will be moving to [facility]  History of Present Illness:    Kim Rice is a 77 y.o. female past medical history significant for paroxysmal atrial fibrillation, status post atrial fibrillation ablation done many years ago after that anticoagulation has been discontinued and then in December 2021 she presented back to our hospital with TIA and atrial fibrillation she converted back to sinus rhythm spontaneously anticoagulation has been restarted additional problem include aortic stenosis which is the size last time in December of last year being moderate, COPD, dyslipidemia. Comes today to months for follow-up.  Overall majority of time she says she is doing well but some that she described to have some tightness in the chest that happen in different situations sometimes when she walks sometimes when she sits.  Denies have any palpitations no swelling of lower extremities  Past Medical History:  Diagnosis Date   Abnormal myocardial perfusion study 10/18/2017   Anemia    after hysterectomy at age 34   Anxiety    Aortic atherosclerosis ([place]) 09/15/2019   Aortic valve regurgitation, acquired    01/18/14 TEE (HPR): mild AI, no AS   Arthritis    osteoarthritis   Asthma    Atrial fibrillation and flutter (HCC)    Chronic right-sided low back pain without sciatica 02/29/2016   Chronic right-sided thoracic back pain 02/29/2016   Class 1 obesity due to excess calories with serious comorbidity and body mass index (BMI) of 33.0 to 33.9 in adult 01/17/2016   Congestive heart failure (CHF) ([place]) 02/04/2020   Degenerative cervical disc 06/06/2018   Essential hypertension 01/17/2016   Family history of adverse reaction to anesthesia    dad  said during eye surgery he felt everything   Frequent UTI    hx of - none for a year (as of 01/05/15)   GERD (gastroesophageal reflux disease)    H/O seasonal allergies    High risk medication use 01/17/2016   History of blood transfusion 09/14/2016   History of Graves' disease 01/17/2016   Hyperlipidemia    Hypertension    Hypothyroidism    Late effect of cerebrovascular accident (CVA) 11/02/2019   Lipoma    just under right breast   Major depressive disorder 01/17/2016   Mild aortic stenosis 10/18/2017   Mitral regurgitation moderate by echocardiogram from 2021 01/17/2016   Mitral valve regurgitation    Trace MR/TR by 01/18/14 TEE (HPR)   Mixed hyperlipidemia 01/17/2016   OA (osteoarthritis) 01/17/2016   Obesity (BMI 30-39.9)    Osteoarthritis of left knee 01/18/2015   PAF (paroxysmal atrial fibrillation) ([place]) 01/17/2016   Formatting of this note might be different from the original. Ablation 2015.  TIA 08/2019.  Now on eliquis.   Persistent atrial fibrillation ([place]) 09/30/2019   Postmenopausal 01/17/2016   Prediabetes    Primary hyperparathyroidism ([place]) 01/17/2016   Formatting of this note might be different from the original. Negative w/u in 2015 Abnormal 2021.   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   Thyroid disease    was hyperthyroid, had radiation "Graves Disease"   TIA (transient ischemic  attack) 09/15/2019   Tinea corporis 01/17/2016    Past Surgical History:  Procedure Laterality Date   APPENDECTOMY  1980   during exploratory surgery for endometriosis   ATRIAL FIBRILLATION ABLATION  01/30/14   BACK SURGERY  1981   lumbar disc removed   CARDIOVERSION  06/25/13   COLONOSCOPY     KNEE ARTHROSCOPY W/ MENISCAL REPAIR Left 2014   ROTATOR CUFF REPAIR Left    throat polyps removed  1979   TONSILLECTOMY     TOTAL KNEE ARTHROPLASTY Left 01/18/2015   Procedure: LEFT TOTAL KNEE  ARTHROPLASTY;  Surgeon: Mcarthur Rossetti, MD;  Location: [facility];  Service: Orthopedics;  Laterality: Left;   VAGINAL HYSTERECTOMY  1976    Current Medications: Current Meds  Medication Sig   albuterol (VENTOLIN HFA) 108 (90 Base) MCG/ACT inhaler Inhale 2 puffs into the lungs every 6 (six) hours as needed for wheezing or shortness of breath.   apixaban (ELIQUIS) 5 MG TABS tablet Take 5 mg by mouth 2 (two) times daily.   aspirin 81 MG tablet Take 81 mg by mouth daily.   atenolol (TENORMIN) 100 MG tablet Take 50 mg by mouth in the morning and at bedtime.   atorvastatin (LIPITOR) 80 MG tablet Take 80 mg by mouth daily.    dapagliflozin propanediol (FARXIGA) 10 MG TABS tablet Take 1 tablet (10 mg total) by mouth daily before breakfast.   EPINEPHrine 0.3 mg/0.3 mL IJ SOAJ injection Inject 0.3 mg into the muscle as needed for anaphylaxis.   escitalopram (LEXAPRO) 10 MG tablet Take 10 mg by mouth at bedtime.    esomeprazole (NEXIUM) 40 MG capsule Take 40 mg by mouth every evening.    ezetimibe (ZETIA) 10 MG tablet Take 1 tablet (10 mg total) by mouth daily.   fluticasone (FLONASE) 50 MCG/ACT nasal spray Place 1 spray into both nostrils as needed for allergies.   Fluticasone-Salmeterol (ADVAIR) 250-50 MCG/DOSE AEPB Inhale 1 puff into the lungs 2 (two) times daily as needed (wheezing).   furosemide (LASIX) 80 MG tablet Take 1.5 tablets (120 mg total) by mouth daily.   gabapentin (NEURONTIN) 300 MG capsule Take 300 mg by mouth daily as needed (nerve pain).   levothyroxine (SYNTHROID) 88 MCG tablet Take 88 mcg by mouth every morning.   lisinopril (ZESTRIL) 10 MG tablet Take 10 mg by mouth daily.   nitroGLYCERIN (NITROSTAT) 0.4 MG SL tablet Place 1 tablet (0.4 mg total) under the tongue every 5 (five) minutes as needed for chest pain.   potassium chloride (KLOR-CON) 10 MEQ tablet Take 1 tablet (10 mEq total) by mouth daily.     Allergies:   Codeine and Latex   Social History   Socioeconomic  History   Marital status: Widowed    Spouse name: Not on file   Number of children: Not on file   Years of education: Not on file   Highest education level: Not on file  Occupational History   Not on file  Tobacco Use   Smoking status: Former    Types: Cigarettes    Quit date: 01/04/1985    Years since quitting: 37.9    Passive exposure: Past   Smokeless tobacco: Never  Vaping Use   Vaping Use: Never used  Substance and Sexual Activity   Alcohol use: Yes    Comment: occasional   Drug use: No   Sexual activity: Not on file  Other Topics Concern   Not on file  Social History Narrative   Not  on file   Social Determinants of Health   Financial Resource Strain: Not on file  Food Insecurity: Not on file  Transportation Needs: Not on file  Physical Activity: Not on file  Stress: Not on file  Social Connections: Not on file     Family History: The patient's family history includes Autism spectrum disorder in her son; CAD in her mother; COPD in her daughter; Cancer in her father; Crohn's disease in her daughter; Heart attack in her mother; Heart disease in her father; High Cholesterol in her brother, brother, daughter, and father; Hypertension in her brother, brother, and mother. ROS:   Please see the history of present illness.    All 14 point review of systems negative except as described per history of present illness  EKGs/Labs/Other Studies Reviewed:      Recent Labs: 09/14/2022: BUN 13; Creatinine, Ser 0.90; NT-Pro BNP 235; Potassium 4.5; Sodium 144  Recent Lipid Panel    Component Value Date/Time   CHOL 162 06/07/2021 0804   TRIG 190 (H) 06/07/2021 0804   HDL 53 06/07/2021 0804   CHOLHDL 3.1 06/07/2021 0804   LDLCALC 77 06/07/2021 0804    Physical Exam:    VS:  BP 110/60 (BP Location: Right Arm, Patient Position: Sitting, Cuff Size: Normal)   Pulse (!) 58   Ht 5\' 2"  (1.575 m)   Wt 182 lb (82.6 kg)   SpO2 95%   BMI 33.29 kg/m     Wt Readings from Last  3 Encounters:  12/14/22 182 lb (82.6 kg)  09/14/22 182 lb (82.6 kg)  05/08/22 188 lb 6.4 oz (85.5 kg)     GEN:  Well nourished, well developed in no acute distress HEENT: Normal NECK: No JVD; No carotid bruits LYMPHATICS: No lymphadenopathy CARDIAC: RRR, systolic murmur grade 2/6 to 3/6 best at right upper portion of sternum, no rubs, no gallops RESPIRATORY:  Clear to auscultation without rales, wheezing or rhonchi  ABDOMEN: Soft, non-tender, non-distended MUSCULOSKELETAL:  No edema; No deformity  SKIN: Warm and dry LOWER EXTREMITIES: no swelling NEUROLOGIC:  Alert and oriented x 3 PSYCHIATRIC:  Normal affect   ASSESSMENT:    1. PAF (paroxysmal atrial fibrillation) (HCC)   2. Chest pressure   3. Nonrheumatic aortic valve stenosis   4. Atypical chest pain    PLAN:    In order of problems listed above:  Paroxysmal atrial fibrillation seems to maintain sinus rhythm and anticoagulated which I will continue. Chest pressure will schedule her to have Lexiscan. Nonrheumatic arctic valve stenosis.  In May we will do another echocardiogram to reassess the valve. Essential hypertension blood pressure seems to well-controlled. Dyslipidemia I did review her K PN which show me her LDL 77 HDL 53 however this is from September 2022 therefore we will repeat the test today   Medication Adjustments/Labs and Tests Ordered: Current medicines are reviewed at length with the patient today.  Concerns regarding medicines are outlined above.  No orders of the defined types were placed in this encounter.  Medication changes:  Meds ordered this encounter  Medications   nitroGLYCERIN (NITROSTAT) 0.4 MG SL tablet    Sig: Place 1 tablet (0.4 mg total) under the tongue every 5 (five) minutes as needed for chest pain.    Dispense:  25 tablet    Refill:  3    Signed, Park Liter, MD, [facility] 12/14/2022 10:19 AM    [place]

## 2022-12-14 NOTE — Patient Instructions (Signed)
Medication Instructions:  Your physician recommends that you continue on your current medications as directed. Please refer to the Current Medication list given to you today.  *If you need a refill on your cardiac medications before your next appointment, please call your pharmacy*   Lab Work:  CMP, Direct LDL - today        Testing/Procedures: May 2024:  Your physician has requested that you have an echocardiogram. Echocardiography is a painless test that uses sound waves to create images of your heart. It provides your doctor with information about the size and shape of your heart and how well your heart's chambers and valves are working. This procedure takes approximately one hour. There are no restrictions for this procedure. Please do NOT wear cologne, perfume, aftershave, or lotions (deodorant is allowed). Please arrive 15 minutes prior to your appointment time.   Your physician has requested that you have a lexiscan myoview. For further information please visit [email or web address]. Please follow instruction sheet, as given.  The test will take approximately 3 to 4 hours to complete; you may bring reading material.  If someone comes with you to your appointment, they will need to remain in the main lobby due to limited space in the testing area.   How to prepare for your Myocardial Perfusion Test: Do not eat or drink 3 hours prior to your test, except you may have water. Do not consume products containing caffeine (regular or decaffeinated) 12 hours prior to your test. (ex: coffee, chocolate, sodas, tea). Do bring a list of your current medications with you.  If not listed below, you may take your medications as normal. Do wear comfortable clothes (no dresses or overalls) and walking shoes, tennis shoes preferred (No heels or open toe shoes are allowed). Do NOT wear cologne, perfume, aftershave, or lotions (deodorant is allowed). If these instructions are not followed, your test  will have to be rescheduled.    Follow-Up: At [facility], you and your health needs are our priority.  As part of our continuing mission to provide you with exceptional heart care, we have created designated Provider Care Teams.  These Care Teams include your primary Cardiologist (physician) and Advanced Practice Providers (APPs -  Physician Assistants and Nurse Practitioners) who all work together to provide you with the care you need, when you need it.  We recommend signing up for the patient portal called "MyChart".  Sign up information is provided on this After Visit Summary.  MyChart is used to connect with patients for Virtual Visits (Telemedicine).  Patients are able to view lab/test results, encounter notes, upcoming appointments, etc.  Non-urgent messages can be sent to your provider as well.   To learn more about what you can do with MyChart, go to [email or web address].    Your next appointment:   6 month(s)  The format for your next appointment:   In Person  Provider:   Jenne Campus, MD    Other Instructions NA

## 2022-12-15 LAB — COMPREHENSIVE METABOLIC PANEL
ALT: 17 IU/L (ref 0–32)
AST: 23 IU/L (ref 0–40)
Albumin/Globulin Ratio: 1.5 (ref 1.2–2.2)
Albumin: 4.5 g/dL (ref 3.8–4.8)
Alkaline Phosphatase: 100 IU/L (ref 44–121)
BUN/Creatinine Ratio: 19 (ref 12–28)
BUN: 19 mg/dL (ref 8–27)
Bilirubin Total: 0.6 mg/dL (ref 0.0–1.2)
CO2: 25 mmol/L (ref 20–29)
Calcium: 10.1 mg/dL (ref 8.7–10.3)
Chloride: 98 mmol/L (ref 96–106)
Creatinine, Ser: 1.01 mg/dL — ABNORMAL HIGH (ref 0.57–1.00)
Globulin, Total: 3 g/dL (ref 1.5–4.5)
Glucose: 105 mg/dL — ABNORMAL HIGH (ref 70–99)
Potassium: 4.7 mmol/L (ref 3.5–5.2)
Sodium: 139 mmol/L (ref 134–144)
Total Protein: 7.5 g/dL (ref 6.0–8.5)
eGFR: 58 mL/min/{1.73_m2} — ABNORMAL LOW (ref >59)

## 2022-12-15 LAB — LDL CHOLESTEROL, DIRECT: LDL Direct: 58 mg/dL (ref 0–99)

## 2022-12-18 ENCOUNTER — Telehealth (HOSPITAL_COMMUNITY): Payer: Self-pay | Admitting: *Deleted

## 2022-12-18 NOTE — Telephone Encounter (Signed)
Per DPR left detailed instructions on answering machine for MPI study scheduled on 12/27/22.

## 2022-12-19 ENCOUNTER — Telehealth: Payer: Self-pay

## 2022-12-19 NOTE — Telephone Encounter (Signed)
Left message on My Chart with normal results per Dr. Krasowski's note. Routed to PCP. 

## 2022-12-21 NOTE — Addendum Note (Signed)
Addended by: Gypsy Balsam on: 12/21/2022 10:50 AM   Modules accepted: Orders

## 2022-12-25 ENCOUNTER — Telehealth: Payer: Self-pay

## 2022-12-25 NOTE — Telephone Encounter (Signed)
Pt viewed lab results on My Chart per Dr. Vanetta Shawl note. Routed to PCP.

## 2022-12-27 ENCOUNTER — Ambulatory Visit: Payer: Medicare Other | Attending: Cardiology

## 2022-12-27 ENCOUNTER — Telehealth: Payer: Self-pay

## 2022-12-27 DIAGNOSIS — R0789 Other chest pain: Secondary | ICD-10-CM | POA: Insufficient documentation

## 2022-12-27 LAB — MYOCARDIAL PERFUSION IMAGING
LV dias vol: 67 mL (ref 46–106)
LV sys vol: 18 mL
Nuc Stress EF: 74 %
Peak HR: 75 {beats}/min
Rest HR: 53 {beats}/min
Rest Nuclear Isotope Dose: 8.9 mCi
SDS: 2
SRS: 5
SSS: 7
Stress Nuclear Isotope Dose: 27.8 mCi
TID: 1.14

## 2022-12-27 MED ORDER — TECHNETIUM TC 99M TETROFOSMIN IV KIT
8.9000 | PACK | Freq: Once | INTRAVENOUS | Status: AC | PRN
  Administered 2022-12-27: 8.9 via INTRAVENOUS

## 2022-12-27 MED ORDER — TECHNETIUM TC 99M TETROFOSMIN IV KIT
27.8000 | PACK | Freq: Once | INTRAVENOUS | Status: AC | PRN
  Administered 2022-12-27: 27.8 via INTRAVENOUS

## 2022-12-27 MED ORDER — REGADENOSON 0.4 MG/5ML IV SOLN
0.4000 mg | Freq: Once | INTRAVENOUS | Status: AC
Start: 2022-12-27 — End: 2022-12-27
  Administered 2022-12-27: 0.4 mg via INTRAVENOUS

## 2022-12-27 NOTE — Telephone Encounter (Signed)
Left message on My Chart with normal results per Dr. Krasowski's note. Routed to PCP. 

## 2023-01-02 DIAGNOSIS — Z136 Encounter for screening for cardiovascular disorders: Secondary | ICD-10-CM | POA: Insufficient documentation

## 2023-01-02 DIAGNOSIS — R5381 Other malaise: Secondary | ICD-10-CM | POA: Insufficient documentation

## 2023-01-16 ENCOUNTER — Ambulatory Visit: Payer: Medicare Other | Attending: Cardiology

## 2023-01-16 DIAGNOSIS — R0609 Other forms of dyspnea: Secondary | ICD-10-CM | POA: Diagnosis present

## 2023-01-16 LAB — ECHOCARDIOGRAM COMPLETE
AR max vel: 1.1 cm2
AV Area VTI: 1.19 cm2
AV Area mean vel: 1.08 cm2
AV Mean grad: 21.8 mmHg
AV Peak grad: 37.8 mmHg
Ao pk vel: 3.07 m/s
P 1/2 time: 466 msec
S' Lateral: 2.6 cm

## 2023-01-18 ENCOUNTER — Telehealth: Payer: Self-pay

## 2023-01-18 NOTE — Telephone Encounter (Signed)
Patient notified of results.

## 2023-01-18 NOTE — Telephone Encounter (Signed)
-----   Message from Georgeanna Lea, MD sent at 01/17/2023  8:40 AM EDT ----- Echocardiogram showed normal left ventricle ejection fraction mild mitral valve regurgitation, moderate aortic valve stenosis, continue monitoring

## 2023-01-22 NOTE — Telephone Encounter (Signed)
error 

## 2023-06-06 ENCOUNTER — Ambulatory Visit: Payer: Medicare Other | Attending: Cardiology | Admitting: Cardiology

## 2023-06-06 ENCOUNTER — Encounter: Payer: Self-pay | Admitting: Cardiology

## 2023-06-06 VITALS — BP 110/58 | HR 62 | Ht 63.0 in | Wt 177.6 lb

## 2023-06-06 DIAGNOSIS — R0789 Other chest pain: Secondary | ICD-10-CM | POA: Diagnosis present

## 2023-06-06 DIAGNOSIS — G459 Transient cerebral ischemic attack, unspecified: Secondary | ICD-10-CM | POA: Insufficient documentation

## 2023-06-06 DIAGNOSIS — I480 Paroxysmal atrial fibrillation: Secondary | ICD-10-CM | POA: Diagnosis present

## 2023-06-06 DIAGNOSIS — I350 Nonrheumatic aortic (valve) stenosis: Secondary | ICD-10-CM | POA: Insufficient documentation

## 2023-06-06 NOTE — Patient Instructions (Signed)

## 2023-06-06 NOTE — Progress Notes (Signed)
Cardiology Office Note:    Date:  06/06/2023   ID:  Hillary Bow, DOB 09-18-1945, MRN [id number]  PCP:  Gordan Payment., MD  Cardiologist:  Gypsy Balsam, MD    Referring MD: Gordan Payment., MD   Chief Complaint  Patient presents with   Follow-up    History of Present Illness:    Kim Rice is a 77 y.o. female  past medical history significant for paroxysmal atrial fibrillation, status post atrial fibrillation ablation done many years ago after that anticoagulation has been discontinued and then in December 2021 she presented back to our hospital with TIA and atrial fibrillation she converted back to sinus rhythm spontaneously anticoagulation has been restarted additional problem include aortic stenosis which is the size last time in December of last year being moderate, COPD, dyslipidemia.  Comes today to months for follow-up.  She is not doing well she got cold like symptoms for last for 5 days.  She has been coughing she is doing well feeling weak sneezing but no fever no chills no shortness of breath she did not check herself for COVID.  Cardiac wise doing well denies have any palpitation no tightness squeezing pressure burning chest.  Past Medical History:  Diagnosis Date   Abnormal myocardial perfusion study 10/18/2017   Anemia    after hysterectomy at age 63   Anxiety    Aortic atherosclerosis (HCC) 09/15/2019   Aortic valve regurgitation, acquired    01/18/14 TEE ([facility]): mild AI, no AS   Arthritis    osteoarthritis   Asthma    Atrial fibrillation and flutter (HCC)    Chronic right-sided low back pain without sciatica 02/29/2016   Chronic right-sided thoracic back pain 02/29/2016   Class 1 obesity due to excess calories with serious comorbidity and body mass index (BMI) of 33.0 to 33.9 in adult 01/17/2016   Congestive heart failure (CHF) (HCC) 02/04/2020   Degenerative cervical disc 06/06/2018   Essential hypertension 01/17/2016   Family history of adverse reaction to  anesthesia    dad said during eye surgery he felt everything   Frequent UTI    hx of - none for a year (as of 01/05/15)   GERD (gastroesophageal reflux disease)    H/O seasonal allergies    High risk medication use 01/17/2016   History of blood transfusion 09/14/2016   History of Graves' disease 01/17/2016   Hyperlipidemia    Hypertension    Hypothyroidism    Late effect of cerebrovascular accident (CVA) 11/02/2019   Lipoma    just under right breast   Major depressive disorder 01/17/2016   Mild aortic stenosis 10/18/2017   Mitral regurgitation moderate by echocardiogram from 2021 01/17/2016   Mitral valve regurgitation    Trace MR/TR by 01/18/14 TEE ([facility])   Mixed hyperlipidemia 01/17/2016   OA (osteoarthritis) 01/17/2016   Obesity (BMI 30-39.9)    Osteoarthritis of left knee 01/18/2015   PAF (paroxysmal atrial fibrillation) (HCC) 01/17/2016   Formatting of this note might be different from the original. Ablation 2015.  TIA 08/2019.  Now on eliquis.   Persistent atrial fibrillation (HCC) 09/30/2019   Postmenopausal 01/17/2016   Prediabetes    Primary hyperparathyroidism (HCC) 01/17/2016   Formatting of this note might be different from the original. Negative w/u in 2015 Abnormal 2021.   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   Thyroid disease    was  hyperthyroid, had radiation "Graves Disease"   TIA (transient ischemic attack) 09/15/2019   Tinea corporis 01/17/2016    Past Surgical History:  Procedure Laterality Date   APPENDECTOMY  1980   during exploratory surgery for endometriosis   ATRIAL FIBRILLATION ABLATION  01/30/14   BACK SURGERY  1981   lumbar disc removed   CARDIOVERSION  06/25/13   COLONOSCOPY     KNEE ARTHROSCOPY W/ MENISCAL REPAIR Left 2014   ROTATOR CUFF REPAIR Left    throat polyps removed  1979   TONSILLECTOMY     TOTAL KNEE ARTHROPLASTY Left 01/18/2015   Procedure: LEFT  TOTAL KNEE ARTHROPLASTY;  Surgeon: Kathryne Hitch, MD;  Location: [facility] OR;  Service: Orthopedics;  Laterality: Left;   VAGINAL HYSTERECTOMY  1976    Current Medications: Current Meds  Medication Sig   albuterol (VENTOLIN HFA) 108 (90 Base) MCG/ACT inhaler Inhale 2 puffs into the lungs every 6 (six) hours as needed for wheezing or shortness of breath.   apixaban (ELIQUIS) 5 MG TABS tablet Take 5 mg by mouth 2 (two) times daily.   aspirin 81 MG tablet Take 81 mg by mouth daily.   atenolol (TENORMIN) 100 MG tablet Take 50 mg by mouth in the morning and at bedtime.   atorvastatin (LIPITOR) 80 MG tablet Take 80 mg by mouth daily.    dapagliflozin propanediol (FARXIGA) 10 MG TABS tablet Take 1 tablet (10 mg total) by mouth daily before breakfast.   EPINEPHrine 0.3 mg/0.3 mL IJ SOAJ injection Inject 0.3 mg into the muscle as needed for anaphylaxis.   escitalopram (LEXAPRO) 10 MG tablet Take 10 mg by mouth at bedtime.    esomeprazole (NEXIUM) 40 MG capsule Take 40 mg by mouth every evening.    ezetimibe (ZETIA) 10 MG tablet Take 1 tablet (10 mg total) by mouth daily.   fluticasone (FLONASE) 50 MCG/ACT nasal spray Place 1 spray into both nostrils as needed for allergies.   Fluticasone-Salmeterol (ADVAIR) 250-50 MCG/DOSE AEPB Inhale 1 puff into the lungs 2 (two) times daily as needed (wheezing).   furosemide (LASIX) 80 MG tablet Take 1.5 tablets (120 mg total) by mouth daily. (Patient taking differently: Take 80 mg by mouth daily.)   gabapentin (NEURONTIN) 300 MG capsule Take 300 mg by mouth daily as needed (nerve pain).   levothyroxine (SYNTHROID) 88 MCG tablet Take 88 mcg by mouth every morning.   lisinopril (ZESTRIL) 10 MG tablet Take 10 mg by mouth daily.   nitroGLYCERIN (NITROSTAT) 0.4 MG SL tablet Place 1 tablet (0.4 mg total) under the tongue every 5 (five) minutes as needed for chest pain.   potassium chloride (KLOR-CON) 10 MEQ tablet Take 1 tablet (10 mEq total) by mouth daily.      Allergies:   Codeine and Latex   Social History   Socioeconomic History   Marital status: Widowed    Spouse name: Not on file   Number of children: Not on file   Years of education: Not on file   Highest education level: Not on file  Occupational History   Not on file  Tobacco Use   Smoking status: Former    Current packs/day: 0.00    Types: Cigarettes    Quit date: 01/04/1985    Years since quitting: 38.4    Passive exposure: Past   Smokeless tobacco: Never  Vaping Use   Vaping status: Never Used  Substance and Sexual Activity   Alcohol use: Yes    Comment: occasional   Drug  use: No   Sexual activity: Not on file  Other Topics Concern   Not on file  Social History Narrative   Not on file   Social Determinants of Health   Financial Resource Strain: Not on file  Food Insecurity: Not on file  Transportation Needs: Not on file  Physical Activity: Not on file  Stress: Not on file  Social Connections: Not on file     Family History: The patient's family history includes Autism spectrum disorder in her son; CAD in her mother; COPD in her daughter; Cancer in her father; Crohn's disease in her daughter; Heart attack in her mother; Heart disease in her father; High Cholesterol in her brother, brother, daughter, and father; Hypertension in her brother, brother, and mother. ROS:   Please see the history of present illness.    All 14 point review of systems negative except as described per history of present illness  EKGs/Labs/Other Studies Reviewed:         Recent Labs: 09/14/2022: NT-Pro BNP 235 12/14/2022: ALT 17; BUN 19; Creatinine, Ser 1.01; Potassium 4.7; Sodium 139  Recent Lipid Panel    Component Value Date/Time   CHOL 162 06/07/2021 0804   TRIG 190 (H) 06/07/2021 0804   HDL 53 06/07/2021 0804   CHOLHDL 3.1 06/07/2021 0804   LDLCALC 77 06/07/2021 0804   LDLDIRECT 58 12/14/2022 1035    Physical Exam:    VS:  BP (!) 110/58 (BP Location: Left Arm,  Patient Position: Sitting)   Pulse 62   Ht 5\' 3"  (1.6 m)   Wt 177 lb 9.6 oz (80.6 kg)   SpO2 96%   BMI 31.46 kg/m     Wt Readings from Last 3 Encounters:  06/06/23 177 lb 9.6 oz (80.6 kg)  12/14/22 182 lb (82.6 kg)  09/14/22 182 lb (82.6 kg)     GEN:  Well nourished, well developed in no acute distress HEENT: Normal NECK: No JVD; No carotid bruits LYMPHATICS: No lymphadenopathy CARDIAC: RRR, systolic ejection murmur grade 2/6 best heard right upper portion of the sternum, no rubs, no gallops RESPIRATORY:  Clear to auscultation without rales, wheezing or rhonchi  ABDOMEN: Soft, non-tender, non-distended MUSCULOSKELETAL:  No edema; No deformity  SKIN: Warm and dry LOWER EXTREMITIES: no swelling NEUROLOGIC:  Alert and oriented x 3 PSYCHIATRIC:  Normal affect   ASSESSMENT:    1. PAF (paroxysmal atrial fibrillation) (HCC)   2. Nonrheumatic aortic valve stenosis   3. TIA (transient ischemic attack)   4. Atypical chest pain    PLAN:    In order of problems listed above:  Paroxysmal atrial fibrillation seems to maintain sinus rhythm we will continue present management which includes anticoagulation. Aortic stenosis.  Calculated aortic valve area 1.19 moderate aortic stenosis, mean gradient only 21, dimensionless index 0.38.  Continue monitoring. History of TIA while she was off anticoagulation now she is back on it doing well no signs and symptoms of TIA. Atypical chest pain, stress test done in April of this year was negative for ischemia. She informed me today that she is going to relocate to [facility], she will be looking for a cardiologist there.   Medication Adjustments/Labs and Tests Ordered: Current medicines are reviewed at length with the patient today.  Concerns regarding medicines are outlined above.  No orders of the defined types were placed in this encounter.  Medication changes: No orders of the defined types were placed in this  encounter.   Signed, Georgeanna Lea, MD, [facility]  06/06/2023 11:17 AM     [facility]

## 2023-10-14 ENCOUNTER — Telehealth: Payer: Self-pay | Admitting: *Deleted

## 2023-10-14 NOTE — Telephone Encounter (Signed)
   Pre-operative Risk Assessment    Patient Name: Kim Rice  DOB: 07-15-1946 MRN: [id number]   Date of last office visit: 06/06/23 DR. KRASOWSKI Date of next office visit: NONE   Request for Surgical Clearance    Procedure:  LEFT ANKLE OPEN REDUCTION INTERNAL FIXATION. LEFT ANKLE Fx AND LEFT SYNDESMOSIS, POSSIBLE LEFT DELTOID REPAIR  Date of Surgery:  Clearance 10/16/23                                Surgeon: DR. Vicki Mallet  Surgeon's Group or Practice Name:  [facility] Phone number:  [phone]  Fax number:  [phone]   Type of Clearance Requested:   - Medical  - Pharmacy:  Hold Aspirin and Apixaban (Eliquis)     Type of Anesthesia:   POPLITEAL/ADDUCTOR BLOCK   Additional requests/questions:    Elpidio Anis   10/14/2023, 5:13 PM

## 2023-10-14 NOTE — Telephone Encounter (Signed)
   Name: Kim Rice  DOB: 1946-07-08  MRN: [id number]  Primary Cardiologist: Gypsy Balsam, MD   Preoperative team, please contact this patient and set up a phone call appointment for further preoperative risk assessment. Please obtain consent and complete medication review. Thank you for your help.  Will send to PharmD for recommendations regarding holding anticoagulation.   I also confirmed the patient resides in the state of [place]. As per [facility] [employer] telemedicine laws, the patient must reside in the state in which the provider is licensed. Rosalita Levan, [place]   Tereso Newcomer, PA-C 10/14/2023, 5:22 PM [facility]

## 2023-10-15 NOTE — Telephone Encounter (Signed)
We have not to been able to reach the pt yet to add her to preop appt today

## 2023-10-15 NOTE — Telephone Encounter (Signed)
I tried to call the pt yesterday evening and again this morning. Both times the pt's phone just rings and no VM picks up. Per Tereso Newcomer, PAC pt is to be added on today 11:20.

## 2023-10-15 NOTE — Telephone Encounter (Signed)
Patient with diagnosis of afib on Eliquis for anticoagulation.    Procedure: LEFT ANKLE OPEN REDUCTION INTERNAL FIXATION. LEFT ANKLE Fx AND LEFT SYNDESMOSIS, POSSIBLE LEFT DELTOID REPAIR  Date of procedure: 10/16/23   CHA2DS2-VASc Score = 7   This indicates a 11.2% annual risk of stroke. The patient's score is based upon: CHF History: 1 HTN History: 1 Diabetes History: 0 Stroke History: 2 Vascular Disease History: 0 Age Score: 2 Gender Score: 1      CrCl 64 ml/min Platelet count 312  Patient had TIA while not on anticoagulation. Had stopped anticoagulation after ablation.   Per office protocol, patient can hold Eliquis for 2 days prior to procedure.    **This guidance is not considered finalized until pre-operative APP has relayed final recommendations.**

## 2023-10-16 NOTE — Telephone Encounter (Signed)
Pt never called out office back to schedule a tele preop appt. Surgery was scheduled for today. I will send back to preop if any further notes needed.

## 2023-10-18 NOTE — Telephone Encounter (Signed)
Per preop APP I called the surgeon office to inquire if surgery has been done or still need cardiac clearance. If still need cardiac clearance the pt needs to call our office to schedule a tele preop appt. We have left a number of messages previously to help accommodate with the clearance.

## 2023-10-22 NOTE — Telephone Encounter (Signed)
 I have tried again to reach the surgeon to confirm if needing clearance or surgery has been done. Unable to reach the surgeon office. We have sent notes previously checking about clearance.   I will fax notes to surgeon office today as well and remove from the pre op call back pool.   We do ask if the pt still needs for the surgeon office to please reply.
